# Patient Record
Sex: Male | Born: 1960 | Race: White | Hispanic: No | Marital: Single | Smoking: Former smoker
Health system: Southern US, Community
[De-identification: ages and names within clinical notes are randomized; demographics above are authoritative.]

## PROBLEM LIST (undated history)

## (undated) DIAGNOSIS — M1990 Unspecified osteoarthritis, unspecified site: Secondary | ICD-10-CM

## (undated) DIAGNOSIS — I10 Essential (primary) hypertension: Secondary | ICD-10-CM

## (undated) HISTORY — PX: KNEE SURGERY: SHX244

## (undated) HISTORY — PX: FRACTURE SURGERY: SHX138

## (undated) HISTORY — PX: TONSILLECTOMY: SUR1361

---

## 2014-06-10 ENCOUNTER — Ambulatory Visit: Payer: Self-pay | Admitting: Physician Assistant

## 2016-10-14 ENCOUNTER — Ambulatory Visit: Admission: EM | Admit: 2016-10-14 | Discharge: 2016-10-14 | Discharge status: Absent without leave | Payer: Self-pay

## 2016-10-15 ENCOUNTER — Ambulatory Visit
Admission: EM | Admit: 2016-10-15 | Discharge: 2016-10-15 | Disposition: A | Discharge status: Home / Self Care | Payer: PRIVATE HEALTH INSURANCE | Attending: Family Medicine | Admitting: Family Medicine

## 2016-10-15 ENCOUNTER — Ambulatory Visit (INDEPENDENT_AMBULATORY_CARE_PROVIDER_SITE_OTHER): Payer: PRIVATE HEALTH INSURANCE

## 2016-10-15 DIAGNOSIS — S46002A Unspecified injury of muscle(s) and tendon(s) of the rotator cuff of left shoulder, initial encounter: Secondary | ICD-10-CM

## 2016-10-15 HISTORY — DX: Essential (primary) hypertension: I10

## 2016-10-15 MED ORDER — NAPROXEN 500 MG PO TABS: 500.0000 mg | ORAL_TABLET | Freq: Two times a day (BID) | ORAL | 0 refills | Status: DC

## 2016-10-15 MED ORDER — METHYLPREDNISOLONE 4 MG PO TBPK: ORAL_TABLET | ORAL | 0 refills | Status: DC

## 2016-10-15 NOTE — ED Provider Notes (Signed)
CSN: [id number]     Arrival date & time 10/15/16  0803 History   First MD Initiated Contact with Patient 10/15/16 [phone]     Chief Complaint  Patient presents with  . Shoulder Pain   (Consider location/radiation/quality/duration/timing/severity/associated sxs/prior Treatment) HPI  This is a 56 year old male who presents with the acute onset of left arm pain. He states he's had 3-4 weeks of intermittent left shoulder pain. History of work he was extending his left arm which is nondominant wall carrying heavy tube and felt a pop in the indicates right at the coracoid process. Ring pain in the arm is unable to lift his arm over his shoulder. He has no radiation of the pain.        Past Medical History:  Diagnosis Date  . Hypertension    Past Surgical History:  Procedure Laterality Date  . KNEE SURGERY     History reviewed. No pertinent family history. Social History  Substance Use Topics  . Smoking status: Former Games developermoker  . Smokeless tobacco: Never Used  . Alcohol use Yes     Comment: some every day    Review of Systems  Constitutional: Positive for activity change. Negative for chills, fatigue and fever.  Musculoskeletal: Positive for arthralgias.  All other systems reviewed and are negative.   Allergies  Patient has no known allergies.  Home Medications   Prior to Admission medications   Medication Sig Start Date End Date Taking? Authorizing Provider  lisinopril (PRINIVIL,ZESTRIL) 30 MG tablet Take 30 mg by mouth daily.   Yes [provider]  methylPREDNISolone (MEDROL DOSEPAK) 4 MG TBPK tablet Take per package instructions 10/15/16   Lutricia Feiloemer, Mikey Maffett P, PA-C  naproxen (NAPROSYN) 500 MG tablet Take 1 tablet (500 mg total) by mouth 2 (two) times daily with a meal. 10/15/16   Lutricia Feiloemer, Katriel Cutsforth P, PA-C   Meds Ordered and Administered this Visit  Medications - No data to display  BP (!) 158/92 (BP Location: Right Arm)   Pulse 71   Temp 99 F (37.2 C) (Oral)    Resp 18   Ht 5\' 10"  (1.778 m)   Wt 235 lb (106.6 kg)   SpO2 100%   BMI 33.72 kg/m  No data found.   Physical Exam  Constitutional: He appears well-developed and well-nourished. No distress.  HENT:  Head: Normocephalic.  Eyes: Pupils are equal, round, and reactive to light.  Neck: Normal range of motion.  Musculoskeletal:  Examination of the left shoulder shows marked decrease range of motion actively but passively he is able to abduct to 90. Internal and external rotation are full. There is discomfort at the extremes of external rotation. Patient is unable to perform the arm drop test. Empty can test the patient is barely able to hold against gravity and gives way easily with resistance. He does not have any pain over the coracoid process nor under the subacromial area. Neck range of motion is full and comfortable.  Skin: He is not diaphoretic.  Nursing note and vitals reviewed.   Urgent Care Course     Procedures (including critical care time)  Labs Review Labs Reviewed - No data to display  Imaging Review Dg Shoulder Left  Result Date: 10/15/2016 CLINICAL DATA:  Left shoulder pain with limited range of motion after lifting injury yesterday. EXAM: LEFT SHOULDER - 2+ VIEW COMPARISON:  None. FINDINGS: There is no evidence of fracture or dislocation. Minimal degenerative changes at the [facility]. Soft tissues are unremarkable. IMPRESSION: No acute  abnormality.  Minimal arthritic changes at the [facility] joint. Electronically Signed   By: Francene Boyers M.D.   On: 10/15/2016 08:54     Visual Acuity Review  Right Eye Distance:   Left Eye Distance:   Bilateral Distance:    Right Eye Near:   Left Eye Near:    Bilateral Near:         MDM   1. Injury of tendon of left rotator cuff, initial encounter    Discharge Medication List as of 10/15/2016  9:11 AM    START taking these medications   Details  methylPREDNISolone (MEDROL DOSEPAK) 4 MG TBPK tablet Take per package  instructions, Normal    naproxen (NAPROSYN) 500 MG tablet Take 1 tablet (500 mg total) by mouth 2 (two) times daily with a meal., Starting Tue 10/15/2016, Normal      Plan: 1. Test/x-ray results and diagnosis reviewed with patient 2. rx as per orders; risks, benefits, potential side effects reviewed with patient 3. Recommend supportive treatment with Rest and symptom avoidance. he was instructed in pendulum exercises which He will perform 3 times daily for at least a minute each time. He will start with the Medrol Dosepak and when that is completed to switch over to Naprosyn as necessary for pain. He must be careful not to injure the arm by using it and have to alter his Routine at work to accomplish this. 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 4. F/u prn if symptoms worsen or don't improve     Lutricia Feil, PA-C 10/15/16 1610

## 2016-10-15 NOTE — ED Triage Notes (Signed)
Pt with 3-4 weeks of intermittent left shoulder pain. Yesterday at work extended his left arm while carrying a tube and felt a pop. Reports he is unable to lift his arm over his shoulder. Pain 5/10

## 2019-09-03 ENCOUNTER — Ambulatory Visit: Payer: PRIVATE HEALTH INSURANCE

## 2019-09-08 ENCOUNTER — Other Ambulatory Visit: Payer: Self-pay

## 2019-09-08 ENCOUNTER — Ambulatory Visit: Payer: PRIVATE HEALTH INSURANCE | Attending: Internal Medicine

## 2019-09-08 DIAGNOSIS — Z23 Encounter for immunization: Secondary | ICD-10-CM

## 2019-09-08 NOTE — Progress Notes (Signed)
   Covid-19 Vaccination Clinic  Name:  Thorsten Climer Leger    MRN: [id number] DOB: [birth year]  09/08/2019  Mr. Agan was observed post Covid-19 immunization for 15 minutes without incident. He was provided with Vaccine Information Sheet and instruction to access the V-Safe system.   Mr. Zehner was instructed to call 911 with any severe reactions post vaccine: Marland Kitchen Difficulty breathing  . Swelling of face and throat  . A fast heartbeat  . A bad rash all over body  . Dizziness and weakness   Immunizations Administered    Name Date Dose VIS Date Route   Pfizer COVID-19 Vaccine 09/08/2019  2:21 PM 0.3 mL 05/07/2019 Intramuscular   Manufacturer: [employer], [employer]   Lot: [id number]   NDC: [address]-9

## 2019-09-29 ENCOUNTER — Ambulatory Visit: Payer: PRIVATE HEALTH INSURANCE

## 2021-01-26 ENCOUNTER — Other Ambulatory Visit: Payer: Self-pay | Admitting: Orthopedic Surgery

## 2021-01-26 DIAGNOSIS — M4807 Spinal stenosis, lumbosacral region: Secondary | ICD-10-CM

## 2021-01-26 DIAGNOSIS — M545 Low back pain, unspecified: Secondary | ICD-10-CM

## 2021-01-26 DIAGNOSIS — M5136 Other intervertebral disc degeneration, lumbar region: Secondary | ICD-10-CM

## 2021-02-07 ENCOUNTER — Ambulatory Visit
Admission: RE | Admit: 2021-02-07 | Discharge: 2021-02-07 | Disposition: A | Discharge status: Home / Self Care | Payer: 59 | Source: Ambulatory Visit | Attending: Orthopedic Surgery | Admitting: Orthopedic Surgery

## 2021-02-07 ENCOUNTER — Other Ambulatory Visit: Payer: Self-pay

## 2021-02-07 DIAGNOSIS — M5136 Other intervertebral disc degeneration, lumbar region: Secondary | ICD-10-CM | POA: Diagnosis present

## 2021-02-07 DIAGNOSIS — M545 Low back pain, unspecified: Secondary | ICD-10-CM | POA: Diagnosis present

## 2021-02-07 DIAGNOSIS — M4807 Spinal stenosis, lumbosacral region: Secondary | ICD-10-CM | POA: Insufficient documentation

## 2021-07-19 ENCOUNTER — Other Ambulatory Visit: Payer: Self-pay | Admitting: Orthopedic Surgery

## 2021-08-06 ENCOUNTER — Other Ambulatory Visit: Payer: Self-pay

## 2021-08-06 ENCOUNTER — Encounter
Admission: RE | Admit: 2021-08-06 | Discharge: 2021-08-06 | Disposition: A | Discharge status: Home / Self Care | Payer: 59 | Source: Ambulatory Visit | Attending: Orthopedic Surgery | Admitting: Orthopedic Surgery

## 2021-08-06 VITALS — BP 162/83 | HR 95 | Resp 18 | Ht 69.0 in | Wt 258.2 lb

## 2021-08-06 DIAGNOSIS — Z01812 Encounter for preprocedural laboratory examination: Secondary | ICD-10-CM

## 2021-08-06 DIAGNOSIS — Z01818 Encounter for other preprocedural examination: Secondary | ICD-10-CM | POA: Diagnosis present

## 2021-08-06 HISTORY — DX: Unspecified osteoarthritis, unspecified site: M19.90

## 2021-08-06 LAB — COMPREHENSIVE METABOLIC PANEL
ALT: 35 U/L (ref 0–44)
AST: 24 U/L (ref 15–41)
Albumin: 4.5 g/dL (ref 3.5–5.0)
Alkaline Phosphatase: 73 U/L (ref 38–126)
Anion gap: 6 (ref 5–15)
BUN: 16 mg/dL (ref 8–23)
CO2: 26 mmol/L (ref 22–32)
Calcium: 9.7 mg/dL (ref 8.9–10.3)
Chloride: 104 mmol/L (ref 98–111)
Creatinine, Ser: 0.9 mg/dL (ref 0.61–1.24)
GFR, Estimated: >60 mL/min (ref >60)
Glucose, Bld: 103 mg/dL — ABNORMAL HIGH (ref 70–99)
Potassium: 4.4 mmol/L (ref 3.5–5.1)
Sodium: 136 mmol/L (ref 135–145)
Total Bilirubin: 0.6 mg/dL (ref 0.3–1.2)
Total Protein: 7.3 g/dL (ref 6.5–8.1)

## 2021-08-06 LAB — CBC WITH DIFFERENTIAL/PLATELET
Abs Immature Granulocytes: 0.02 10*3/uL (ref 0.00–0.07)
Basophils Absolute: 0.1 10*3/uL (ref 0.0–0.1)
Basophils Relative: 1 %
Eosinophils Absolute: 0.1 10*3/uL (ref 0.0–0.5)
Eosinophils Relative: 2 %
HCT: 43 % (ref 39.0–52.0)
Hemoglobin: 14.4 g/dL (ref 13.0–17.0)
Immature Granulocytes: 0 %
Lymphocytes Relative: 25 %
Lymphs Abs: 1.4 10*3/uL (ref 0.7–4.0)
MCH: 30.8 pg (ref 26.0–34.0)
MCHC: 33.5 g/dL (ref 30.0–36.0)
MCV: 92.1 fL (ref 80.0–100.0)
Monocytes Absolute: 0.5 10*3/uL (ref 0.1–1.0)
Monocytes Relative: 9 %
Neutro Abs: 3.6 10*3/uL (ref 1.7–7.7)
Neutrophils Relative %: 63 %
Platelets: 267 10*3/uL (ref 150–400)
RBC: 4.67 MIL/uL (ref 4.22–5.81)
RDW: 12.1 % (ref 11.5–15.5)
WBC: 5.7 10*3/uL (ref 4.0–10.5)
nRBC: 0 % (ref 0.0–0.2)

## 2021-08-06 LAB — TYPE AND SCREEN
ABO/RH(D): A POS
Antibody Screen: NEGATIVE

## 2021-08-06 LAB — URINALYSIS, ROUTINE W REFLEX MICROSCOPIC
Bilirubin Urine: NEGATIVE
Glucose, UA: NEGATIVE mg/dL
Hgb urine dipstick: NEGATIVE
Ketones, ur: NEGATIVE mg/dL
Leukocytes,Ua: NEGATIVE
Nitrite: NEGATIVE
Protein, ur: NEGATIVE mg/dL
Specific Gravity, Urine: 1.011 (ref 1.005–1.030)
pH: 6 (ref 5.0–8.0)

## 2021-08-06 LAB — SURGICAL PCR SCREEN
MRSA, PCR: NEGATIVE
Staphylococcus aureus: NEGATIVE

## 2021-08-06 NOTE — Patient Instructions (Addendum)
Your procedure is scheduled on: 08/17/21 - Friday ?Report to the Registration Desk on the 1st floor of the [facility]. ?To find out your arrival time, please call [phone] between 1PM - 3PM on: 08/16/21 - Thursday ?Report to [facility] for Covid test on 08/15/21 between 8 am and 12 noon. ? ?REMEMBER: ?Instructions that are not followed completely may result in serious medical risk, up to and including death; or upon the discretion of your surgeon and anesthesiologist your surgery may need to be rescheduled. ? ?Do not eat food after midnight the night before surgery.  ?No gum chewing, lozengers or hard candies. ? ?You may however, drink CLEAR liquids up to 2 hours before you are scheduled to arrive for your surgery. Do not drink anything within 2 hours of your scheduled arrival time. ? ?Clear liquids include: ?- water  ?- apple juice without pulp ?- gatorade (not RED colors) ?- black coffee or tea (Do NOT add milk or creamers to the coffee or tea) ?Do NOT drink anything that is not on this list. ? ?In addition, your doctor has ordered for you to drink the provided  ?Ensure Pre-Surgery Clear Carbohydrate Drink  ?Drinking this carbohydrate drink up to two hours before surgery helps to reduce insulin resistance and improve patient outcomes. Please complete drinking 2 hours prior to scheduled arrival time. ? ?TAKE THESE MEDICATIONS THE MORNING OF SURGERY WITH A SIP OF WATER: ?- amLODipine (NORVASC) 5 MG tablet ? ? ?One week prior to surgery: ?Stop Anti-inflammatories (NSAIDS) such as Advil, Aleve, Ibuprofen, Motrin, Naproxen, Naprosyn and Aspirin based products such as Excedrin, Goodys Powder, BC Powder. ? ?Stop ANY OVER THE COUNTER supplements until after surgery. ? ?You may however, continue to take Tylenol if needed for pain up until the day of surgery. ? ?No Alcohol for 24 hours before or after surgery. ? ?No Smoking including e-cigarettes for 24 hours prior to surgery.  ?No chewable tobacco products for at  least 6 hours prior to surgery.  ?No nicotine patches on the day of surgery. ? ?Do not use any "recreational" drugs for at least a week prior to your surgery.  ?Please be advised that the combination of cocaine and anesthesia may have negative outcomes, up to and including death. ?If you test positive for cocaine, your surgery will be cancelled. ? ?On the morning of surgery brush your teeth with toothpaste and water, you may rinse your mouth with mouthwash if you wish. ?Do not swallow any toothpaste or mouthwash. ? ?Use CHG Soap or wipes as directed on instruction sheet. ? ?Do not wear jewelry, make-up, hairpins, clips or nail polish. ? ?Do not wear lotions, powders, or perfumes.  ? ?Do not shave body from the neck down 48 hours prior to surgery just in case you cut yourself which could leave a site for infection.  ?Also, freshly shaved skin may become irritated if using the CHG soap. ? ?Contact lenses, hearing aids and dentures may not be worn into surgery. ? ?Do not bring valuables to the hospital. [facility] is not responsible for any missing/lost belongings or valuables.  ? ?Notify your doctor if there is any change in your medical condition (cold, fever, infection). ? ?Wear comfortable clothing (specific to your surgery type) to the hospital. ? ?After surgery, you can help prevent lung complications by doing breathing exercises.  ?Take deep breaths and cough every 1-2 hours. Your doctor may order a device called an Incentive Spirometer to help you take deep breaths. ?When  coughing or sneezing, hold a pillow firmly against your incision with both hands. This is called ?splinting.? Doing this helps protect your incision. It also decreases belly discomfort. ? ?If you are being admitted to the hospital overnight, leave your suitcase in the car. ?After surgery it may be brought to your room. ? ?If you are being discharged the day of surgery, you will not be allowed to drive home. ?You will need a responsible adult  (18 years or older) to drive you home and stay with you that night.  ? ?If you are taking public transportation, you will need to have a responsible adult (18 years or older) with you. ?Please confirm with your physician that it is acceptable to use public transportation.  ? ?Please call the Pre-admissions Testing Dept. at [phone] if you have any questions about these instructions. ? ?Surgery Visitation Policy: ? ?Patients undergoing a surgery or procedure may have one family member or support person with them as long as that person is not COVID-19 positive or experiencing its symptoms.  ?That person may remain in the waiting area during the procedure and may rotate out with other people. ? ?Inpatient Visitation:   ? ?Visiting hours are 7 a.m. to 8 p.m. ?Up to two visitors ages 16+ are allowed at one time in a patient room. The visitors may rotate out with other people during the day. Visitors must check out when they leave, or other visitors will not be allowed. One designated support person may remain overnight. ?The visitor must pass COVID-19 screenings, use hand sanitizer when entering and exiting the patient?s room and wear a mask at all times, including in the patient?s room. ?Patients must also wear a mask when staff or their visitor are in the room. ?Masking is required regardless of vaccination status.  ?

## 2021-08-15 ENCOUNTER — Other Ambulatory Visit: Admission: RE | Admit: 2021-08-15 | Payer: 59 | Source: Ambulatory Visit

## 2021-08-17 ENCOUNTER — Ambulatory Visit: Payer: 59

## 2021-08-17 ENCOUNTER — Other Ambulatory Visit: Payer: Self-pay

## 2021-08-17 ENCOUNTER — Ambulatory Visit: Payer: 59 | Admitting: Anesthesiology

## 2021-08-17 ENCOUNTER — Observation Stay: Payer: 59

## 2021-08-17 ENCOUNTER — Observation Stay
Admission: RE | Admit: 2021-08-17 | Discharge: 2021-08-18 | Disposition: A | Discharge status: Home / Self Care | Payer: 59 | Attending: Orthopedic Surgery | Admitting: Orthopedic Surgery

## 2021-08-17 ENCOUNTER — Ambulatory Visit: Payer: 59 | Admitting: Urgent Care

## 2021-08-17 ENCOUNTER — Encounter
Admission: RE | Disposition: A | Discharge status: Home / Self Care | Payer: Self-pay | Source: Home / Self Care | Attending: Orthopedic Surgery

## 2021-08-17 ENCOUNTER — Encounter: Payer: Self-pay | Admitting: Orthopedic Surgery

## 2021-08-17 DIAGNOSIS — M1611 Unilateral primary osteoarthritis, right hip: Principal | ICD-10-CM | POA: Insufficient documentation

## 2021-08-17 DIAGNOSIS — M25551 Pain in right hip: Secondary | ICD-10-CM | POA: Diagnosis present

## 2021-08-17 DIAGNOSIS — Z79899 Other long term (current) drug therapy: Secondary | ICD-10-CM | POA: Insufficient documentation

## 2021-08-17 DIAGNOSIS — Z96641 Presence of right artificial hip joint: Secondary | ICD-10-CM

## 2021-08-17 DIAGNOSIS — I10 Essential (primary) hypertension: Secondary | ICD-10-CM | POA: Diagnosis not present

## 2021-08-17 HISTORY — PX: TOTAL HIP ARTHROPLASTY: SHX124

## 2021-08-17 LAB — CBC
HCT: 38.8 % — ABNORMAL LOW (ref 39.0–52.0)
Hemoglobin: 13.2 g/dL (ref 13.0–17.0)
MCH: 31.2 pg (ref 26.0–34.0)
MCHC: 34 g/dL (ref 30.0–36.0)
MCV: 91.7 fL (ref 80.0–100.0)
Platelets: 247 10*3/uL (ref 150–400)
RBC: 4.23 MIL/uL (ref 4.22–5.81)
RDW: 12 % (ref 11.5–15.5)
WBC: 11.7 10*3/uL — ABNORMAL HIGH (ref 4.0–10.5)
nRBC: 0 % (ref 0.0–0.2)

## 2021-08-17 LAB — ABO/RH: ABO/RH(D): A POS

## 2021-08-17 LAB — CREATININE, SERUM
Creatinine, Ser: 0.85 mg/dL (ref 0.61–1.24)
GFR, Estimated: >60 mL/min (ref >60)

## 2021-08-17 SURGERY — ARTHROPLASTY, HIP, TOTAL, ANTERIOR APPROACH
Anesthesia: General | Site: Hip | Laterality: Right

## 2021-08-17 MED ORDER — BUPIVACAINE LIPOSOME 1.3 % IJ SUSP
INTRAMUSCULAR | Status: AC
  Filled 2021-08-17: qty 20

## 2021-08-17 MED ORDER — TRAMADOL HCL 50 MG PO TABS
50.0000 mg | ORAL_TABLET | Freq: Four times a day (QID) | ORAL | Status: DC
  Administered 2021-08-17 – 2021-08-18 (×3): 50 mg via ORAL
  Filled 2021-08-17 (×3): qty 1

## 2021-08-17 MED ORDER — SODIUM CHLORIDE 0.9 % IR SOLN
Status: DC | PRN
  Administered 2021-08-17: 1004 mL

## 2021-08-17 MED ORDER — NEOMYCIN-POLYMYXIN B GU 40-200000 IR SOLN
Status: AC
  Filled 2021-08-17: qty 4

## 2021-08-17 MED ORDER — ONDANSETRON HCL 4 MG/2ML IJ SOLN: 4.0000 mg | Freq: Four times a day (QID) | INTRAMUSCULAR | Status: DC | PRN

## 2021-08-17 MED ORDER — STERILE WATER FOR IRRIGATION IR SOLN
Status: DC | PRN
  Administered 2021-08-17: 1000 mL

## 2021-08-17 MED ORDER — MIDAZOLAM HCL 5 MG/5ML IJ SOLN
INTRAMUSCULAR | Status: DC | PRN
  Administered 2021-08-17 (×2): 2 mg via INTRAVENOUS

## 2021-08-17 MED ORDER — BUPIVACAINE-EPINEPHRINE (PF) 0.25% -1:200000 IJ SOLN
INTRAMUSCULAR | Status: AC
Start: 2021-08-17 — End: ?
  Filled 2021-08-17: qty 30

## 2021-08-17 MED ORDER — CHLORHEXIDINE GLUCONATE 0.12 % MT SOLN
OROMUCOSAL | Status: AC
  Administered 2021-08-17: 15 mL via OROMUCOSAL
  Filled 2021-08-17: qty 15

## 2021-08-17 MED ORDER — ENOXAPARIN SODIUM 40 MG/0.4ML IJ SOSY
40.0000 mg | PREFILLED_SYRINGE | INTRAMUSCULAR | Status: DC
  Administered 2021-08-18: 40 mg via SUBCUTANEOUS
  Filled 2021-08-17: qty 0.4

## 2021-08-17 MED ORDER — ACETAMINOPHEN 10 MG/ML IV SOLN: 1000.0000 mg | Freq: Once | INTRAVENOUS | Status: DC | PRN

## 2021-08-17 MED ORDER — PHENYLEPHRINE HCL-NACL 20-0.9 MG/250ML-% IV SOLN
INTRAVENOUS | Status: DC | PRN
  Administered 2021-08-17: 35 ug/min via INTRAVENOUS

## 2021-08-17 MED ORDER — BUPIVACAINE HCL (PF) 0.5 % IJ SOLN
INTRAMUSCULAR | Status: DC | PRN
  Administered 2021-08-17: 2.5 mL via INTRATHECAL

## 2021-08-17 MED ORDER — HYDROMORPHONE HCL 1 MG/ML IJ SOLN
0.5000 mg | INTRAMUSCULAR | Status: DC | PRN
  Filled 2021-08-17: qty 1

## 2021-08-17 MED ORDER — ACETAMINOPHEN 10 MG/ML IV SOLN
INTRAVENOUS | Status: DC | PRN
  Administered 2021-08-17: 1000 mg via INTRAVENOUS

## 2021-08-17 MED ORDER — LISINOPRIL 20 MG PO TABS
40.0000 mg | ORAL_TABLET | Freq: Every day | ORAL | Status: DC
  Administered 2021-08-18: 40 mg via ORAL
  Filled 2021-08-17: qty 2

## 2021-08-17 MED ORDER — FENTANYL CITRATE (PF) 100 MCG/2ML IJ SOLN: 25.0000 ug | INTRAMUSCULAR | Status: DC | PRN

## 2021-08-17 MED ORDER — MAGNESIUM HYDROXIDE 400 MG/5ML PO SUSP
30.0000 mL | Freq: Every day | ORAL | Status: DC
  Administered 2021-08-17: 30 mL via ORAL
  Filled 2021-08-17: qty 30

## 2021-08-17 MED ORDER — FENTANYL CITRATE (PF) 100 MCG/2ML IJ SOLN
INTRAMUSCULAR | Status: AC
  Filled 2021-08-17: qty 2

## 2021-08-17 MED ORDER — ADULT MULTIVITAMIN W/MINERALS CH
1.0000 | ORAL_TABLET | Freq: Every day | ORAL | Status: DC
  Administered 2021-08-18: 1 via ORAL
  Filled 2021-08-17: qty 1

## 2021-08-17 MED ORDER — SENNOSIDES-DOCUSATE SODIUM 8.6-50 MG PO TABS: 1.0000 | ORAL_TABLET | Freq: Every evening | ORAL | Status: DC | PRN

## 2021-08-17 MED ORDER — MIDAZOLAM HCL 2 MG/2ML IJ SOLN
INTRAMUSCULAR | Status: AC
  Filled 2021-08-17: qty 2

## 2021-08-17 MED ORDER — ONDANSETRON HCL 4 MG/2ML IJ SOLN
INTRAMUSCULAR | Status: DC | PRN
  Administered 2021-08-17: 4 mg via INTRAVENOUS

## 2021-08-17 MED ORDER — ACETAMINOPHEN 10 MG/ML IV SOLN
INTRAVENOUS | Status: AC
  Filled 2021-08-17: qty 100

## 2021-08-17 MED ORDER — OXYCODONE HCL 5 MG PO TABS: 10.0000 mg | ORAL_TABLET | ORAL | Status: DC | PRN

## 2021-08-17 MED ORDER — BISACODYL 5 MG PO TBEC: 5.0000 mg | DELAYED_RELEASE_TABLET | Freq: Every day | ORAL | Status: DC | PRN

## 2021-08-17 MED ORDER — METOCLOPRAMIDE HCL 5 MG/ML IJ SOLN: 5.0000 mg | Freq: Three times a day (TID) | INTRAMUSCULAR | Status: DC | PRN

## 2021-08-17 MED ORDER — ALUM & MAG HYDROXIDE-SIMETH 200-200-20 MG/5ML PO SUSP: 30.0000 mL | ORAL | Status: DC | PRN

## 2021-08-17 MED ORDER — ACETAMINOPHEN 500 MG PO TABS
1000.0000 mg | ORAL_TABLET | Freq: Four times a day (QID) | ORAL | Status: DC
  Administered 2021-08-17 – 2021-08-18 (×2): 1000 mg via ORAL
  Filled 2021-08-17 (×3): qty 2

## 2021-08-17 MED ORDER — DIPHENHYDRAMINE HCL 12.5 MG/5ML PO ELIX: 12.5000 mg | ORAL_SOLUTION | ORAL | Status: DC | PRN

## 2021-08-17 MED ORDER — ONDANSETRON HCL 4 MG PO TABS: 4.0000 mg | ORAL_TABLET | Freq: Four times a day (QID) | ORAL | Status: DC | PRN

## 2021-08-17 MED ORDER — MIDAZOLAM HCL 2 MG/2ML IJ SOLN
INTRAMUSCULAR | Status: AC
Start: 2021-08-17 — End: ?
  Filled 2021-08-17: qty 2

## 2021-08-17 MED ORDER — MENTHOL 3 MG MT LOZG
1.0000 | LOZENGE | OROMUCOSAL | Status: DC | PRN
  Filled 2021-08-17: qty 9

## 2021-08-17 MED ORDER — CEFAZOLIN SODIUM-DEXTROSE 2-4 GM/100ML-% IV SOLN
2.0000 g | INTRAVENOUS | Status: AC
  Administered 2021-08-17: 2 g via INTRAVENOUS

## 2021-08-17 MED ORDER — METHOCARBAMOL 1000 MG/10ML IJ SOLN
500.0000 mg | Freq: Four times a day (QID) | INTRAVENOUS | Status: DC | PRN
  Filled 2021-08-17: qty 5

## 2021-08-17 MED ORDER — OXYCODONE HCL 5 MG/5ML PO SOLN: 5.0000 mg | Freq: Once | ORAL | Status: DC | PRN

## 2021-08-17 MED ORDER — METHOCARBAMOL 500 MG PO TABS: 500.0000 mg | ORAL_TABLET | Freq: Four times a day (QID) | ORAL | Status: DC | PRN

## 2021-08-17 MED ORDER — CEFAZOLIN SODIUM-DEXTROSE 2-4 GM/100ML-% IV SOLN
INTRAVENOUS | Status: AC
  Filled 2021-08-17: qty 100

## 2021-08-17 MED ORDER — LACTATED RINGERS IV SOLN: INTRAVENOUS | Status: DC

## 2021-08-17 MED ORDER — ONDANSETRON HCL 4 MG/2ML IJ SOLN: 4.0000 mg | Freq: Once | INTRAMUSCULAR | Status: DC | PRN

## 2021-08-17 MED ORDER — FAMOTIDINE 20 MG PO TABS: 20.0000 mg | ORAL_TABLET | Freq: Once | ORAL | Status: AC

## 2021-08-17 MED ORDER — ORAL CARE MOUTH RINSE: 15.0000 mL | Freq: Once | OROMUCOSAL | Status: AC

## 2021-08-17 MED ORDER — FAMOTIDINE 20 MG PO TABS
ORAL_TABLET | ORAL | Status: AC
  Administered 2021-08-17: 20 mg via ORAL
  Filled 2021-08-17: qty 1

## 2021-08-17 MED ORDER — CEFAZOLIN SODIUM-DEXTROSE 2-4 GM/100ML-% IV SOLN
2.0000 g | Freq: Four times a day (QID) | INTRAVENOUS | Status: AC
  Administered 2021-08-17 (×2): 2 g via INTRAVENOUS
  Filled 2021-08-17 (×2): qty 100

## 2021-08-17 MED ORDER — OXYCODONE HCL 5 MG PO TABS: 5.0000 mg | ORAL_TABLET | ORAL | Status: DC | PRN

## 2021-08-17 MED ORDER — SODIUM CHLORIDE FLUSH 0.9 % IV SOLN
INTRAVENOUS | Status: AC
  Filled 2021-08-17: qty 40

## 2021-08-17 MED ORDER — PROPOFOL 500 MG/50ML IV EMUL
INTRAVENOUS | Status: DC | PRN
  Administered 2021-08-17: 80 ug/kg/min via INTRAVENOUS

## 2021-08-17 MED ORDER — ZOLPIDEM TARTRATE 5 MG PO TABS: 5.0000 mg | ORAL_TABLET | Freq: Every evening | ORAL | Status: DC | PRN

## 2021-08-17 MED ORDER — DOCUSATE SODIUM 100 MG PO CAPS
100.0000 mg | ORAL_CAPSULE | Freq: Two times a day (BID) | ORAL | Status: DC
  Administered 2021-08-17 – 2021-08-18 (×3): 100 mg via ORAL
  Filled 2021-08-17 (×3): qty 1

## 2021-08-17 MED ORDER — HEMOSTATIC AGENTS (NO CHARGE) OPTIME
TOPICAL | Status: DC | PRN
  Administered 2021-08-17: 2 via TOPICAL

## 2021-08-17 MED ORDER — PROPOFOL 1000 MG/100ML IV EMUL
INTRAVENOUS | Status: AC
  Filled 2021-08-17: qty 100

## 2021-08-17 MED ORDER — FENTANYL CITRATE (PF) 100 MCG/2ML IJ SOLN
INTRAMUSCULAR | Status: DC | PRN
  Administered 2021-08-17 (×2): 50 ug via INTRAVENOUS

## 2021-08-17 MED ORDER — PANTOPRAZOLE SODIUM 40 MG PO TBEC
40.0000 mg | DELAYED_RELEASE_TABLET | Freq: Every day | ORAL | Status: DC
  Administered 2021-08-17 – 2021-08-18 (×2): 40 mg via ORAL
  Filled 2021-08-17 (×2): qty 1

## 2021-08-17 MED ORDER — ACETAMINOPHEN 325 MG PO TABS: 325.0000 mg | ORAL_TABLET | Freq: Four times a day (QID) | ORAL | Status: DC | PRN

## 2021-08-17 MED ORDER — SODIUM CHLORIDE 0.9 % IV SOLN: INTRAVENOUS | Status: DC

## 2021-08-17 MED ORDER — OXYCODONE HCL 5 MG PO TABS: 5.0000 mg | ORAL_TABLET | Freq: Once | ORAL | Status: DC | PRN

## 2021-08-17 MED ORDER — METOCLOPRAMIDE HCL 5 MG PO TABS
5.0000 mg | ORAL_TABLET | Freq: Three times a day (TID) | ORAL | Status: DC | PRN
  Filled 2021-08-17: qty 2

## 2021-08-17 MED ORDER — CHLORHEXIDINE GLUCONATE 0.12 % MT SOLN: 15.0000 mL | Freq: Once | OROMUCOSAL | Status: AC

## 2021-08-17 MED ORDER — FLEET ENEMA 7-19 GM/118ML RE ENEM: 1.0000 | ENEMA | Freq: Once | RECTAL | Status: DC | PRN

## 2021-08-17 MED ORDER — PHENOL 1.4 % MT LIQD
1.0000 | OROMUCOSAL | Status: DC | PRN
  Filled 2021-08-17: qty 177

## 2021-08-17 MED ORDER — SODIUM CHLORIDE (PF) 0.9 % IJ SOLN
INTRAMUSCULAR | Status: DC | PRN
  Administered 2021-08-17: 90 mL

## 2021-08-17 MED ORDER — AMLODIPINE BESYLATE 5 MG PO TABS
5.0000 mg | ORAL_TABLET | Freq: Every day | ORAL | Status: DC
  Administered 2021-08-18: 5 mg via ORAL
  Filled 2021-08-17: qty 1

## 2021-08-17 SURGICAL SUPPLY — 51 items
BLADE SAGITTAL AGGR TOOTH XLG (BLADE) ×2 IMPLANT
BNDG COHESIVE 6X5 TAN ST LF (GAUZE/BANDAGES/DRESSINGS) ×6 IMPLANT
CANISTER WOUND CARE 500ML ATS (WOUND CARE) ×2 IMPLANT
CHLORAPREP W/TINT 26 (MISCELLANEOUS) ×2 IMPLANT
COVER BACK TABLE REUSABLE LG (DRAPES) ×2 IMPLANT
CUP ACETAB VERSA DBL 28X58 DMI (Orthopedic Implant) ×1 IMPLANT
DRAPE 3/4 80X56 (DRAPES) ×6 IMPLANT
DRAPE C-ARM XRAY 36X54 (DRAPES) ×2 IMPLANT
DRAPE INCISE IOBAN 66X60 STRL (DRAPES) IMPLANT
DRAPE POUCH INSTRU U-SHP 10X18 (DRAPES) ×2 IMPLANT
DRESSING SURGICEL FIBRLLR 1X2 (HEMOSTASIS) ×2 IMPLANT
DRSG MEPILEX SACRM 8.7X9.8 (GAUZE/BANDAGES/DRESSINGS) ×2 IMPLANT
DRSG SURGICEL FIBRILLAR 1X2 (HEMOSTASIS) ×4
ELECT BLADE 6.5 EXT (BLADE) ×2 IMPLANT
ELECT REM PT RETURN 9FT ADLT (ELECTROSURGICAL) ×2
ELECTRODE REM PT RTRN 9FT ADLT (ELECTROSURGICAL) ×1 IMPLANT
GLOVE SURG SYN 9.0  PF PI (GLOVE) ×2
GLOVE SURG SYN 9.0 PF PI (GLOVE) ×2 IMPLANT
GLOVE SURG UNDER POLY LF SZ9 (GLOVE) ×2 IMPLANT
GOWN SRG 2XL LVL 4 RGLN SLV (GOWNS) ×1 IMPLANT
GOWN STRL NON-REIN 2XL LVL4 (GOWNS) ×1
GOWN STRL REUS W/ TWL LRG LVL3 (GOWN DISPOSABLE) ×1 IMPLANT
GOWN STRL REUS W/TWL LRG LVL3 (GOWN DISPOSABLE) ×1
HEAD FEMORAL SZ28 LGE BIOLOX (Head) ×1 IMPLANT
HOLDER FOLEY CATH W/STRAP (MISCELLANEOUS) ×2 IMPLANT
KIT PREVENA INCISION MGT 13 (CANNISTER) ×2 IMPLANT
MANIFOLD NEPTUNE II (INSTRUMENTS) ×2 IMPLANT
MAT ABSORB  FLUID 56X50 GRAY (MISCELLANEOUS) ×1
MAT ABSORB FLUID 56X50 GRAY (MISCELLANEOUS) ×1 IMPLANT
NDL SPNL 20GX3.5 QUINCKE YW (NEEDLE) ×2 IMPLANT
NEEDLE SPNL 20GX3.5 QUINCKE YW (NEEDLE) ×4 IMPLANT
NS IRRIG 1000ML POUR BTL (IV SOLUTION) ×2 IMPLANT
PACK HIP COMPR (MISCELLANEOUS) ×2 IMPLANT
SCALPEL PROTECTED #10 DISP (BLADE) ×4 IMPLANT
SHELL ACETABULAR SZ0 58MM (Shell) ×1 IMPLANT
STAPLER SKIN PROX 35W (STAPLE) ×2 IMPLANT
STEM MASTERLOC HIP LAT S7 (Stem) ×1 IMPLANT
STRAP SAFETY 5IN WIDE (MISCELLANEOUS) ×2 IMPLANT
SUT DVC 2 QUILL PDO  T11 36X36 (SUTURE) ×1
SUT DVC 2 QUILL PDO T11 36X36 (SUTURE) ×1 IMPLANT
SUT SILK 0 (SUTURE) ×1
SUT SILK 0 30XBRD TIE 6 (SUTURE) ×1 IMPLANT
SUT V-LOC 90 ABS DVC 3-0 CL (SUTURE) ×2 IMPLANT
SUT VIC AB 1 CT1 36 (SUTURE) ×2 IMPLANT
SYR 20ML LL LF (SYRINGE) ×2 IMPLANT
SYR 30ML LL (SYRINGE) ×2 IMPLANT
SYR 50ML LL SCALE MARK (SYRINGE) ×4 IMPLANT
SYR BULB IRRIG 60ML STRL (SYRINGE) ×2 IMPLANT
TAPE MICROFOAM 4IN (TAPE) ×2 IMPLANT
TOWEL OR 17X26 4PK STRL BLUE (TOWEL DISPOSABLE) IMPLANT
TRAY FOLEY MTR SLVR 16FR STAT (SET/KITS/TRAYS/PACK) ×2 IMPLANT

## 2021-08-17 NOTE — Evaluation (Signed)
Physical Therapy Evaluation ?Patient Details ?Name: Larry Michael ?MRN: [id number] ?DOB: [birth year] ?Today's Date: 08/17/2021 ? ?History of Present Illness ? Pt is a 61 yo M with PMH of HTN who was diagnosed with primary osteoarthritis of right hip and is now s/p elective R THA. ?  ?Clinical Impression ? Pt was pleasant and motivated to participate during the session and put forth good effort throughout. Pt required no physical assistance during the session but did require cuing for general sequencing with transfers and gait.  Pt was able to amb near the EOB and then to the chair with good stability and with no adverse symptoms.  Pt is expected to make good progress towards goals while in acute care.  Pt will benefit from HHPT upon discharge to safely address deficits listed in patient problem list for decreased caregiver assistance and eventual return to PLOF. ? ?   ?   ? ?Recommendations for follow up therapy are one component of a multi-disciplinary discharge planning process, led by the attending physician.  Recommendations may be updated based on patient status, additional functional criteria and insurance authorization. ? ?Follow Up Recommendations Home health PT ? ?  ?Assistance Recommended at Discharge Intermittent Supervision/Assistance  ?Patient can return home with the following ? A little help with walking and/or transfers;A little help with bathing/dressing/bathroom;Assistance with cooking/housework;Help with stairs or ramp for entrance;Assist for transportation ? ?  ?Equipment Recommendations Other (comment) ([facility] benefit [facility], TBD, has elevated toilet with sink counter to assist)  ?Recommendations for Other Services ?    ?  ?Functional Status Assessment Patient has had a recent decline in their functional status and demonstrates the ability to make significant improvements in function in a reasonable and predictable amount of time.  ? ?  ?Precautions / Restrictions Precautions ?Precautions:  Anterior Hip;Fall ?Precaution Booklet Issued: Yes (comment) ?Restrictions ?Weight Bearing Restrictions: Yes ?RLE Weight Bearing: Weight bearing as tolerated  ? ?  ? ?Mobility ? Bed Mobility ?Overal bed mobility: Needs Assistance ?Bed Mobility: Supine to Sit ?  ?  ?Supine to sit: Supervision ?  ?  ?General bed mobility comments: Extra time, effort, and use of the bed rail required ?  ? ?Transfers ?Overall transfer level: Needs assistance ?Equipment used: Rolling walker (2 wheels) ?Transfers: Sit to/from Stand ?Sit to Stand: Min guard ?  ?  ?  ?  ?  ?General transfer comment: Min verbal cues for hand placement; good control and stability ?  ? ?Ambulation/Gait ?Ambulation/Gait assistance: Min guard ?Gait Distance (Feet): 5 Feet ?Assistive device: Rolling walker (2 wheels) ?Gait Pattern/deviations: Step-to pattern, Antalgic, Decreased stance time - right ?Gait velocity: decreased ?  ?  ?General Gait Details: Pt able to take several steps at the EOB and then bed to chair with no LOB or buckling ? ?Stairs ?  ?  ?  ?  ?  ? ?Wheelchair Mobility ?  ? ?Modified Rankin (Stroke Patients Only) ?  ? ?  ? ?Balance Overall balance assessment: Needs assistance ?Sitting-balance support: No upper extremity supported, Feet supported ?Sitting balance-Leahy Scale: Good ?  ?  ?Standing balance support: Bilateral upper extremity supported, During functional activity ?Standing balance-Leahy Scale: Good ?Standing balance comment: Minimal lean on the RW for pain control, no instability noted ?  ?  ?  ?  ?  ?  ?  ?  ?  ?  ?  ?   ? ? ? ?Pertinent Vitals/Pain Pain Assessment ?Pain Assessment: 0-10 ?Pain Score: 2  ?Pain Location:  R hip ?Pain Descriptors / Indicators: Sore ?Pain Intervention(s): Repositioned, Premedicated before session, Monitored during session  ? ? ?Home Living Family/patient expects to be discharged to:: Private residence ?Living Arrangements: Other relatives ?Available Help at Discharge: Family;Available 24 hours/day ?Type of  Home: House ?Home Access: Stairs to enter ?Entrance Stairs-Rails: Left ?Entrance Stairs-Number of Steps: 2 ?  ?Home Layout: Two level;Able to live on main level with bedroom/bathroom ?Home Equipment: Agricultural consultant (2 wheels) ?   ?  ?Prior Function   ?  ?  ?  ?  ?  ?  ?Mobility Comments: Ind amb without an AD limited community distances only secondary to R hip pain, no fall history ?ADLs Comments: Ind with ADLs ?  ? ? ?Hand Dominance  ?   ? ?  ?Extremity/Trunk Assessment  ? Upper Extremity Assessment ?Upper Extremity Assessment: Overall WFL for tasks assessed ?  ? ?Lower Extremity Assessment ?Lower Extremity Assessment: Generalized weakness;RLE deficits/detail ?RLE Deficits / Details: BLE ankle AROM, strength, and sensation to light touch grossly intact ?RLE: Unable to fully assess due to pain ?RLE Sensation: WNL ?  ? ?   ?Communication  ? Communication: No difficulties  ?Cognition Arousal/Alertness: Awake/alert ?Behavior During Therapy: [facility] for tasks assessed/performed ?Overall Cognitive Status: Within Functional Limits for tasks assessed ?  ?  ?  ?  ?  ?  ?  ?  ?  ?  ?  ?  ?  ?  ?  ?  ?  ?  ?  ? ?  ?General Comments   ? ?  ?Exercises Total Joint Exercises ?Ankle Circles/Pumps: AROM, Strengthening, Both, 10 reps ?Quad Sets: Strengthening, Both, 10 reps ?Gluteal Sets: Strengthening, Both, 10 reps ?Long Arc Quad: AROM, Strengthening, Both, 10 reps ?Knee Flexion: AROM, Strengthening, Both, 10 reps ?Marching in Standing: Strengthening, Both, 5 reps, Standing ?Other Exercises ?Other Exercises: HEP education per handout ?Other Exercises: Anterior hip precaution education  ? ?Assessment/Plan  ?  ?PT Assessment Patient needs continued PT services  ?PT Problem List Decreased strength;Decreased activity tolerance;Decreased balance;Decreased mobility;Decreased knowledge of use of DME;Pain;Decreased knowledge of precautions ? ?   ?  ?PT Treatment Interventions DME instruction;Gait training;Stair training;Functional mobility  training;Therapeutic activities;Therapeutic exercise;Balance training;Patient/family education   ? ?PT Goals (Current goals can be found in the Care Plan section)  ?Acute Rehab PT Goals ?Patient Stated Goal: To walk without pain and to golf ?PT Goal Formulation: With patient ?Time For Goal Achievement: 08/30/21 ?Potential to Achieve Goals: Good ? ?  ?Frequency BID ?  ? ? ?Co-evaluation   ?  ?  ?  ?  ? ? ?  ?AM-PAC PT "6 Clicks" Mobility  ?Outcome Measure Help needed turning from your back to your side while in a flat bed without using bedrails?: A Little ?Help needed moving from lying on your back to sitting on the side of a flat bed without using bedrails?: A Little ?Help needed moving to and from a bed to a chair (including a wheelchair)?: A Little ?Help needed standing up from a chair using your arms (e.g., wheelchair or bedside chair)?: A Little ?Help needed to walk in hospital room?: A Little ?Help needed climbing 3-5 steps with a railing? : A Lot ?6 Click Score: 17 ? ?  ?End of Session Equipment Utilized During Treatment: Gait belt ?Activity Tolerance: Patient tolerated treatment well ?Patient left: in chair;with call bell/phone within reach;with chair alarm set;with SCD's reapplied ?Nurse Communication: Mobility status;Weight bearing status ?PT Visit Diagnosis: Other abnormalities of gait and mobility (  R26.89);Muscle weakness (generalized) (M62.81);Pain ?Pain - Right/Left: Right ?Pain - part of body: Hip ?  ? ?Time: 2774-1287 ?PT Time Calculation (min) (ACUTE ONLY): 46 min ? ? ?Charges:   PT Evaluation ?$PT Eval Moderate Complexity: 1 Mod ?PT Treatments ?$Therapeutic Exercise: 8-22 mins ?$Therapeutic Activity: 8-22 mins ?  ?   ? ?D. Elly Modena PT, DPT ?08/17/21, 5:25 PM ? ? ?

## 2021-08-17 NOTE — Transfer of Care (Signed)
Immediate Anesthesia Transfer of Care Note ? ?Patient: Larry Michael ? ?Procedure(s) Performed: TOTAL HIP ARTHROPLASTY ANTERIOR APPROACH (Right: Hip) ? ?Patient Location: PACU ? ?Anesthesia Type:General ? ?Level of Consciousness: awake and patient cooperative ? ?Airway & Oxygen Therapy: Patient Spontanous Breathing ? ?Post-op Assessment: Report given to RN and Post -op Vital signs reviewed and stable ? ?Post vital signs: Reviewed and stable ? ?Last Vitals:  ?Vitals Value Taken Time  ?BP 118/72 08/17/21 1417  ?Temp    ?Pulse 80 08/17/21 1418  ?Resp 20 08/17/21 1418  ?SpO2 97 % 08/17/21 1418  ?Vitals shown include unvalidated device data. ? ?Last Pain:  ?Vitals:  ? 08/17/21 1115  ?TempSrc: Temporal  ?PainSc: 0-No pain  ?   ? ?  ? ?Complications: No notable events documented. ?

## 2021-08-17 NOTE — Plan of Care (Signed)
  Problem: Education: Goal: Knowledge of General Education information will improve Description: Including pain rating scale, medication(s)/side effects and non-pharmacologic comfort measures Outcome: Progressing   Problem: Activity: Goal: Risk for activity intolerance will decrease Outcome: Progressing   Problem: Nutrition: Goal: Adequate nutrition will be maintained Outcome: Progressing   

## 2021-08-17 NOTE — Plan of Care (Signed)
?  Problem: Education: ?Goal: Knowledge of General Education information will improve ?Description: Including pain rating scale, medication(s)/side effects and non-pharmacologic comfort measures ?08/17/2021 2210 by Deirdre Pippins, RN ?Outcome: Progressing ?08/17/2021 2205 by Deirdre Pippins, RN ?Outcome: Progressing ?  ?Problem: Clinical Measurements: ?Goal: Ability to maintain clinical measurements within normal limits will improve ?Outcome: Progressing ?Goal: Will remain free from infection ?Outcome: Progressing ?  ?Problem: Activity: ?Goal: Risk for activity intolerance will decrease ?08/17/2021 2210 by Deirdre Pippins, RN ?Outcome: Progressing ?08/17/2021 2205 by Deirdre Pippins, RN ?Outcome: Progressing ?  ?Problem: Nutrition: ?Goal: Adequate nutrition will be maintained ?08/17/2021 2210 by Deirdre Pippins, RN ?Outcome: Progressing ?08/17/2021 2205 by Deirdre Pippins, RN ?Outcome: Progressing ?  ?

## 2021-08-17 NOTE — H&P (Signed)
?Chief Complaint  ?Patient presents with  ? Pre-op Exam  ?Scheduled for Right THA 08/17/21 with Dr. Rosita Kea  ? ? ?History of the Present Illness: ?Larry Michael is a 61 y.o. male here today for history and physical for right total hip arthroplasty with Dr. Kennedy Bucker on 08/17/2021. Patient has had greater than 1 year of right hip pain located in the groin that has been interfering with his quality of life and activities daily living. He describes moderate to severe pain. He is seen physiatry and had a hip injection with very short-term relief. Patient has x-rays showing advanced right hip osteoarthritis. He is a very active person, enjoys playing golf and works a lot climbing a Engineer, agricultural. He is unable to complete 18 holes of golf and is unable to climb a ladder. Patient's pain is 10 out of 10 at times located in his groin and lateral hip.  ? ?Patient is not diabetic and has no history of blood clots. ? ?The patient is employed doing labor work Engineer, mining doors and motorized units. ? ?I have reviewed past medical, surgical, social and family history, and allergies as documented in the EMR. ? ?Past Medical History: ?Past Medical History:  ?Diagnosis Date  ? Alopecia areata 06/18/2017  ? Condylomata acuminata  ? Erectile dysfunction  ? Essential hypertension, benign  ? GERD (gastroesophageal reflux disease)  ? Hyperlipidemia  ? Incomplete tear of left rotator cuff 10/14/2016  ? 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 Primary osteoarthritis of left knee 12/16/2016  ? Rupture of right distal biceps tendon 05/08/2015  ? ?Past Surgical History: ?Past Surgical History:  ?Procedure Laterality Date  ? Open Meniscal open repair Left Knee  ? ORIF Right Ankle fx  ? ?Past Family History: ?Family History  ?Problem Relation Age of Onset  ? Breast cancer Cousin  ? ?Medications: ?Current Outpatient Medications Ordered in Epic  ?Medication Sig Dispense Refill  ? acetaminophen (TYLENOL) 500 MG tablet Take 1,000 mg by mouth  once daily  ? amLODIPine (NORVASC) 5 MG tablet Take 1 tablet (5 mg total) by mouth once daily for blood pressure 90 tablet 0  ? lisinopriL (ZESTRIL) 40 MG tablet Take 1 tablet (40 mg total) by mouth once daily for blood pressure 90 tablet 0  ? multivitamin tablet Take 1 tablet by mouth once daily.  ? ?No current Epic-ordered facility-administered medications on file.  ? ?Allergies: ?No Known Allergies  ? ?Body mass index is 36.3 kg/m?. ? ?Review of Systems: ?A comprehensive 14 point ROS was performed, reviewed, and the pertinent orthopaedic findings are documented in the HPI. ? ?Vitals:  ?08/10/21 0906  ?BP: (!) 142/84  ? ? ?General Physical Examination:  ?General:  ?Well developed, well nourished, no apparent distress, normal affect, antalgic gait with no assistive devices. ? ?HEENT: ?Head normocephalic, atraumatic, PERRL.  ? ?Abdomen: ?Soft, non tender, non distended, Bowel sounds present. ? ?Heart: ?Examination of the heart reveals regular, rate, and rhythm. There is no murmur noted on ascultation. There is a normal apical pulse. ? ?Lungs: ?Lungs are clear to auscultation. There is no wheeze, rhonchi, or crackles. There is normal expansion of bilateral chest walls.  ? ?Right hip: ?Right hip has 5 degrees internal rotation and 20 degrees external rotation. Right flexion contracture. No swelling or edema throughout the right lower extremity. Neurovascular intact right lower extremity ? ?Radiographs: ?AP pelvis and lateral x-rays of the right hip were reviewed by me today. These show complete loss of superior joint space with some  loss of superolateral bone. There is subchondral sclerosis and osteophytes, some lateral subluxation. Lateral view shows subchondral cyst formation on head and acetabulum. ? ?X-ray Impression ?Severe osteoarthritis, right hip. ? ?Assessment: ?ICD-10-CM  ?1. Primary osteoarthritis of right hip M16.11  ? ?Plan: ?66. 61 year old male with advanced right hip osteoarthritis. Pain increasing over  the last year to the point where it is interfering with his quality of life and activities daily living. He has had no relief with conservative treatment. Risks, benefits, complications of a right total hip arthroplasty have been discussed with the patient. Patient has agreed and consented the procedure with Dr. Kennedy Bucker on 08/17/2021. ?Electronically signed by Patience Musca, PA at 08/10/2021 9:57 AM EDT ? ?Reviewed  H+P. ?No changes noted. ? ? ?

## 2021-08-17 NOTE — Op Note (Signed)
08/17/2021 ? ?2:20 PM ? ?PATIENT:  Larry Michael  61 y.o. male ? ?PRE-OPERATIVE DIAGNOSIS:  Primary osteoarthritis of right hip  M16.11 ?Chronic right hip pain  M25.551 ? ?POST-OPERATIVE DIAGNOSIS:  Primary osteoarthritis of right hip  M16.11 ?Chronic right hip pain  M25.551 ? ?PROCEDURE:  Procedure(s): ?TOTAL HIP ARTHROPLASTY ANTERIOR APPROACH (Right) ? ?SURGEON: Leitha Schuller, MD ? ?ASSISTANTS: None ? ?ANESTHESIA:   spinal ? ?EBL:  Total I/O ?In: 1000 [I.V.:800; IV Piggyback:200] ?Out: 750 [Urine:600; Blood:150] ? ?BLOOD ADMINISTERED:none ? ?DRAINS:  Incisional wound VAC   ? ?LOCAL MEDICATIONS USED:  MARCAINE    and OTHER Exparel ? ?SPECIMEN: Right femoral head ? ?DISPOSITION OF SPECIMEN:  PATHOLOGY ? ?COUNTS:  YES ? ?TOURNIQUET:  * No tourniquets in log * ? ?IMPLANTS: Medacta Masterloc 7 LAT plus stem with ceramic L 28 mm head, 58 mm Mpact DM cup and liner ? ?DICTATION: .Dragon Dictation   The patient was brought to the operating room and after spinal anesthesia was obtained patient was placed on the operative table with the ipsilateral foot into the Medacta attachment, contralateral leg on a well-padded table. C-arm was brought in and preop template x-ray taken. After prepping and draping in usual sterile fashion appropriate patient identification and timeout procedures were completed. Anterior approach to the hip was obtained and centered over the greater trochanter and TFL muscle. The subcutaneous tissue was incised hemostasis being achieved by electrocautery. TFL fascia was incised and the muscle retracted laterally deep retractor placed. The lateral femoral circumflex vessels were identified and ligated. The anterior capsule was exposed and a capsulotomy performed. The neck was identified and a femoral neck cut carried out with a saw. The head was removed without difficulty and showed sclerotic femoral head and acetabulum. Reaming was carried out to 58 mm and a 58 mm cup trial gave appropriate tightness  to the acetabular component a 58 DM cup was impacted into position. The leg was then externally rotated and ischiofemoral and pubofemoral releases carried out. The femur was sequentially broached to a size 7, size 7 lateralized stem with S head trials were placed and the final components chosen. The 7 lateralized plus stem was inserted along with a ceramic L 28 mm head and 58 mm liner. The hip was reduced and was stable the wound was thoroughly irrigated with fibrillar placed along the posterior capsule and medial neck. The deep fascia ws closed using a heavy Quill after infiltration of 30 cc of quarter percent Sensorcaine with epinephrine diluted with Exparel throughout the case .3-0 V-loc to close the skin with skin staples.  Incisional wound VAC applied and patient was sent to recovery in stable condition.  ? ?PLAN OF CARE: Admit for overnight observation ? ?

## 2021-08-17 NOTE — Anesthesia Procedure Notes (Signed)
Spinal ? ?Patient location during procedure: OR ?Start time: 08/17/2021 12:32 PM ?End time: 08/17/2021 12:39 PM ?Reason for block: surgical anesthesia ?Staffing ?Performed: resident/CRNA  ?Resident/CRNA: Loletha Grayer, CRNA ?Preanesthetic Checklist ?Completed: patient identified, IV checked, site marked, risks and benefits discussed, surgical consent, monitors and equipment checked, pre-op evaluation and timeout performed ?Spinal Block ?Patient position: sitting ?Prep: Betadine ?Patient monitoring: heart rate, continuous pulse ox, blood pressure and cardiac monitor ?Approach: midline ?Location: L4-5 ?Injection technique: single-shot ?Needle ?Needle type: Introducer and Pencan  ?Needle gauge: 24 G ?Needle length: 9 cm ?Assessment ?Events: CSF return ?Additional Notes ?Negative paresthesia. Negative blood return. Positive free-flowing CSF. Expiration date of kit checked and confirmed. Patient tolerated procedure well, without complications. ? ? ? ? ? ?

## 2021-08-17 NOTE — Anesthesia Preprocedure Evaluation (Signed)
Anesthesia Evaluation  ?Patient identified by MRN, date of birth, ID band ?Patient awake ? ? ? ?Reviewed: ?Allergy & Precautions, NPO status , Patient's Chart, lab work & pertinent test results ? ?History of Anesthesia Complications ?Negative for: history of anesthetic complications ? ?Airway ?Mallampati: III ? ?TM Distance: >3 FB ?Neck ROM: Full ? ? ? Dental ?no notable dental hx. ?(+) Teeth Intact ?  ?Pulmonary ?neg pulmonary ROS, neg sleep apnea, neg COPD, Patient abstained from smoking.Not current smoker, former smoker,  ?  ?Pulmonary exam normal ?breath sounds clear to auscultation ? ? ? ? ? ? Cardiovascular ?Exercise Tolerance: Good ?METShypertension, Pt. on medications ?(-) CAD and (-) Past MI (-) dysrhythmias  ?Rhythm:Regular Rate:Normal ?- Systolic murmurs ? ?  ?Neuro/Psych ?negative neurological ROS ? negative psych ROS  ? GI/Hepatic ?neg GERD  ,(+)  ?  ? (-) substance abuse ? ,   ?Endo/Other  ?neg diabetes ? Renal/GU ?negative Renal ROS  ? ?  ?Musculoskeletal ? ?(+) Arthritis ,  ? Abdominal ?  ?Peds ? Hematology ?  ?Anesthesia Other Findings ?Past Medical History: ?No date: Arthritis ?No date: Hypertension ? Reproductive/Obstetrics ? ?  ? ? ? ? ? ? ? ? ? ? ? ? ? ?  ?  ? ? ? ? ? ? ? ? ?Anesthesia Physical ?Anesthesia Plan ? ?ASA: 2 ? ?Anesthesia Plan: Spinal  ? ?Post-op Pain Management: Ofirmev IV (intra-op)*  ? ?Induction: Intravenous ? ?PONV Risk Score and Plan: 1 and Ondansetron, Dexamethasone, Propofol infusion, TIVA and Midazolam ? ?Airway Management Planned: Natural Airway ? ?Additional Equipment: None ? ?Intra-op Plan:  ? ?Post-operative Plan:  ? ?Informed Consent: I have reviewed the patients History and Physical, chart, labs and discussed the procedure including the risks, benefits and alternatives for the proposed anesthesia with the patient or authorized representative who has indicated his/her understanding and acceptance.  ? ? ? ? ? ?Plan Discussed with: CRNA  and Surgeon ? ?Anesthesia Plan Comments: (Discussed R/B/A of neuraxial anesthesia technique with patient: ?- rare risks of spinal/epidural hematoma, nerve damage, infection ?- Risk of PDPH ?- Risk of nausea and vomiting ?- Risk of conversion to general anesthesia and its associated risks, including sore throat, damage to lips/eyes/teeth/oropharynx, and rare risks such as cardiac and respiratory events. ?- Risk of allergic reactions ? ?Discussed the role of CRNA in patient's perioperative care. ? ?Patient voiced understanding.)  ? ? ? ? ? ? ?Anesthesia Quick Evaluation ? ?

## 2021-08-18 DIAGNOSIS — M1611 Unilateral primary osteoarthritis, right hip: Secondary | ICD-10-CM | POA: Diagnosis not present

## 2021-08-18 MED ORDER — TRAMADOL HCL 50 MG PO TABS: 50.0000 mg | ORAL_TABLET | Freq: Four times a day (QID) | ORAL | 0 refills | Status: DC

## 2021-08-18 MED ORDER — ENOXAPARIN SODIUM 40 MG/0.4ML IJ SOSY: 40.0000 mg | PREFILLED_SYRINGE | INTRAMUSCULAR | 0 refills | Status: DC

## 2021-08-18 MED ORDER — OXYCODONE HCL 5 MG PO TABS: 5.0000 mg | ORAL_TABLET | ORAL | 0 refills | Status: DC | PRN

## 2021-08-18 NOTE — Discharge Instructions (Signed)
ANTERIOR APPROACH TOTAL HIP REPLACEMENT POSTOPERATIVE DIRECTIONS ? ? ?Hip Rehabilitation, Guidelines Following Surgery  ?The results of a hip operation are greatly improved after range of motion and muscle strengthening exercises. Follow all safety measures which are given to protect your hip. If any of these exercises cause increased pain or swelling in your joint, decrease the amount until you are comfortable again. Then slowly increase the exercises. Call your caregiver if you have problems or questions.  ? ?HOME CARE INSTRUCTIONS  ?Remove items at home which could result in a fall. This includes throw rugs or furniture in walking pathways.  ?ICE to the affected hip every three hours for 30 minutes at a time and then as needed for pain and swelling.  Continue to use ice on the hip for pain and swelling from surgery. You may notice swelling that will progress down to the foot and ankle.  This is normal after surgery.  Elevate the leg when you are not up walking on it.   ?Continue to use the breathing machine which will help keep your temperature down.  It is common for your temperature to cycle up and down following surgery, especially at night when you are not up moving around and exerting yourself.  The breathing machine keeps your lungs expanded and your temperature down. ?Do not place pillow under knee, focus on keeping the knee straight while resting ? ?DIET ?You may resume your previous home diet once your are discharged from the hospital. ? ?DRESSING / WOUND CARE / SHOWERING ?The wound VAC will remain in place for 5 to 7 days.  After that time.  The alarm will begin to sound.  The wound VAC can be removed and a dry dressing can be applied. ?Keep your dressing dry with showering.  You can keep it covered and pat dry. ?Change the surgical dressing daily and reapply a dry dressing each time. ? ?ACTIVITY ?Walk with your walker as instructed. ?Use walker as long as suggested by your caregivers. ?Avoid periods of  inactivity such as sitting longer than an hour when not asleep. This helps prevent blood clots.  ?You may resume a sexual relationship in one month or when given the OK by your doctor.  ?You may return to work once you are cleared by your doctor.  ?Do not drive a car for 6 weeks or until released by you surgeon.  ?Do not drive while taking narcotics. ? ?WEIGHT BEARING ?Weight bearing as tolerated with assist device (walker, cane, etc) as directed, use it as long as suggested by your surgeon or therapist, typically at least 4-6 weeks. ? ?POSTOPERATIVE CONSTIPATION PROTOCOL ?Constipation - defined medically as fewer than three stools per week and severe constipation as less than one stool per week. ? ?One of the most common issues patients have following surgery is constipation.  Even if you have a regular bowel pattern at home, your normal regimen is likely to be disrupted due to multiple reasons following surgery.  Combination of anesthesia, postoperative narcotics, change in appetite and fluid intake all can affect your bowels.  In order to avoid complications following surgery, here are some recommendations in order to help you during your recovery period. ? ?Colace (docusate) - Pick up an over-the-counter form of Colace or another stool softener and take twice a day as long as you are requiring postoperative pain medications.  Take with a full glass of water daily.  If you experience loose stools or diarrhea, hold the colace until you stool  forms back up.  If your symptoms do not get better within 1 week or if they get worse, check with your doctor. ? ?Dulcolax (bisacodyl) - Pick up over-the-counter and take as directed by the product packaging as needed to assist with the movement of your bowels.  Take with a full glass of water.  Use this product as needed if not relieved by Colace only.  ? ?MiraLax (polyethylene glycol) - Pick up over-the-counter to have on hand.  MiraLax is a solution that will increase the  amount of water in your bowels to assist with bowel movements.  Take as directed and can mix with a glass of water, juice, soda, coffee, or tea.  Take if you go more than two days without a movement. ?Do not use MiraLax more than once per day. Call your doctor if you are still constipated or irregular after using this medication for 7 days in a row. ? ?If you continue to have problems with postoperative constipation, please contact the office for further assistance and recommendations.  If you experience "the worst abdominal pain ever" or develop nausea or vomiting, please contact the office immediatly for further recommendations for treatment. ? ?ITCHING ? If you experience itching with your medications, try taking only a single pain pill, or even half a pain pill at a time.  You can also use Benadryl over the counter for itching or also to help with sleep.  ? ?TED HOSE STOCKINGS ?Wear the elastic stockings on both legs for three weeks following surgery during the day but you may remove then at night for sleeping. ? ?MEDICATIONS ?See your medication summary on the ?After Visit Summary? that the nursing staff will review with you prior to discharge.  You may have some home medications which will be placed on hold until you complete the course of blood thinner medication.  It is important for you to complete the blood thinner medication as prescribed by your surgeon.  Continue your approved medications as instructed at time of discharge. ? ?PRECAUTIONS ?If you experience chest pain or shortness of breath - call 911 immediately for transfer to the hospital emergency department.  ?If you develop a fever greater that 101 F, purulent drainage from wound, increased redness or drainage from wound, foul odor from the wound/dressing, or calf pain - CONTACT YOUR SURGEON.   ?                                                ?FOLLOW-UP APPOINTMENTS ?Make sure you keep all of your appointments after your operation with your  surgeon and caregivers. You should call the office at the above phone number and make an appointment for approximately two weeks after the date of your surgery or on the date instructed by your surgeon outlined in the "After Visit Summary". ? ?RANGE OF MOTION AND STRENGTHENING EXERCISES  ?These exercises are designed to help you keep full movement of your hip joint. Follow your caregiver's or physical therapist's instructions. Perform all exercises about fifteen times, three times per day or as directed. Exercise both hips, even if you have had only one joint replacement. These exercises can be done on a training (exercise) mat, on the floor, on a table or on a bed. Use whatever works the best and is most comfortable for you. Use music or television while you are  exercising so that the exercises are a pleasant break in your day. This will make your life better with the exercises acting as a break in routine you can look forward to.  ?Lying on your back, slowly slide your foot toward your buttocks, raising your knee up off the floor. Then slowly slide your foot back down until your leg is straight again.  ?Lying on your back spread your legs as far apart as you can without causing discomfort.  ?Lying on your side, raise your upper leg and foot straight up from the floor as far as is comfortable. Slowly lower the leg and repeat.  ?Lying on your back, tighten up the muscle in the front of your thigh (quadriceps muscles). You can do this by keeping your leg straight and trying to raise your heel off the floor. This helps strengthen the largest muscle supporting your knee.  ?Lying on your back, tighten up the muscles of your buttocks both with the legs straight and with the knee bent at a comfortable angle while keeping your heel on the floor.  ? ?IF YOU ARE TRANSFERRED TO A SKILLED REHAB FACILITY ?If the patient is transferred to a skilled rehab facility following release from the hospital, a list of the current  medications will be sent to the facility for the patient to continue.  When discharged from the skilled rehab facility, please have the facility set up the patient's Home Health Physical Therapy prior to being rel

## 2021-08-18 NOTE — TOC Transition Note (Signed)
Transition of Care (TOC) - CM/SW Discharge Note ? ? ?Patient Details  ?Name: Larry Michael ?MRN: [id number] ?Date of Birth: 1960-07-19 ? ?Transition of Care (TOC) CM/SW Contact:  ?Bing Quarry, RN ?Phone Number: ?08/18/2021, 9:45 AM ? ? ?Clinical Narrative:  3/25: Patient has discharge orders to today. Outpatient therapy to be arranged via ortho office per Marcelline Mates PA due to difficulty obtaining [facility] agency with patient's insurance. Gabriel Cirri RN CM   ? ? ? ?Final next level of care: OP Rehab ?Barriers to Discharge: Barriers Resolved ? ? ?Patient Goals and CMS Choice ?  ?  ?  ? ?Discharge Placement ?  ?           ?  ?  ?  ?  ? ?Discharge Plan and Services ?  ?  ?           ?DME Arranged: N/A ?DME Agency: NA ?  ?  ?  ?HH Arranged: NA (outpatient will be arranged via ortho office per PA Mun[place]) ?  ?  ?  ?  ? ?Social Determinants of Health (SDOH) Interventions ?  ? ? ?Readmission Risk Interventions ?   ? View : No data to display.  ?  ?  ?  ? ? ? ? ? ?

## 2021-08-18 NOTE — Progress Notes (Signed)
?  Subjective: ?1 Day Post-Op Procedure(s) (LRB): ?TOTAL HIP ARTHROPLASTY ANTERIOR APPROACH (Right) ?Patient reports pain as mild.   ?Patient is well, and has had no acute complaints or problems ?Plan is to go Home after hospital stay. ?Negative for chest pain and shortness of breath ?Fever: no ?Gastrointestinal: Negative for nausea and vomiting ? ?Objective: ?Vital signs in last 24 hours: ?Temp:  [97.4 ?F (36.3 ?C)-98.3 ?F (36.8 ?C)] 98.3 ?F (36.8 ?C) (03/25 [id number]) ?Pulse Rate:  [64-93] 92 (03/25 0307) ?Resp:  [12-20] 14 (03/25 0307) ?BP: (114-157)/(68-92) 150/80 (03/25 0307) ?SpO2:  [98 %-100 %] 98 % (03/25 0307) ?Weight:  [113.4 kg] 113.4 kg (03/24 1115) ? ?Intake/Output from previous day: ? ?Intake/Output Summary (Last 24 hours) at 08/18/2021 0725 ?Last data filed at 08/18/2021 0308 ?Gross per 24 hour  ?Intake 1640 ml  ?Output 1300 ml  ?Net 340 ml  ?  ?Intake/Output this shift: ?No intake/output data recorded. ? ?Labs: ?Recent Labs  ?  08/17/21 ?1714  ?HGB 13.2  ? ?Recent Labs  ?  08/17/21 ?1714  ?WBC 11.7*  ?RBC 4.23  ?HCT 38.8*  ?PLT 247  ? ?Recent Labs  ?  08/17/21 ?1714  ?CREATININE 0.85  ? ?No results for input(s): LABPT, INR in the last 72 hours. ? ? ?EXAM ?General - Patient is Alert and Oriented ?Extremity - Neurovascular intact ?Sensation intact distally ?Dorsiflexion/Plantar flexion intact ?Compartment soft ?Dressing/Incision - clean, dry, with the wound VAC in place ?Motor Function - intact, moving foot and toes well on exam.  Ambulated to the chair. ? ?Past Medical History:  ?Diagnosis Date  ? Arthritis   ? Hypertension   ? ? ?Assessment/Plan: ?1 Day Post-Op Procedure(s) (LRB): ?TOTAL HIP ARTHROPLASTY ANTERIOR APPROACH (Right) ?Principal Problem: ?  Status post total hip replacement, right ? ?Estimated body mass index is 35.87 kg/m? as calculated from the following: ?  Height as of this encounter: 5\' 10"  (1.778 m). ?  Weight as of this encounter: 113.4 kg. ?Advance diet ?Up with therapy ?D/C IV  fluids ?Discharge home with home health ? ?DVT Prophylaxis - Lovenox, Foot Pumps, and TED hose ?Weight-Bearing as tolerated to right leg ? ?Reche Dixon, PA-C ?Orthopaedic Surgery ?08/18/2021, 7:25 AM ? ?

## 2021-08-18 NOTE — Plan of Care (Signed)
?  Problem: Education: ?Goal: Knowledge of General Education information will improve ?Description: Including pain rating scale, medication(s)/side effects and non-pharmacologic comfort measures ?Outcome: Progressing ?  ?Problem: Health Behavior/Discharge Planning: ?Goal: Ability to manage health-related needs will improve ?Outcome: Progressing ?  ?Problem: Clinical Measurements: ?Goal: Will remain free from infection ?Outcome: Progressing ?  ?Problem: Education: ?Goal: Knowledge of General Education information will improve ?Description: Including pain rating scale, medication(s)/side effects and non-pharmacologic comfort measures ?Outcome: Progressing ?  ?Problem: Health Behavior/Discharge Planning: ?Goal: Ability to manage health-related needs will improve ?Outcome: Progressing ?  ?Problem: Clinical Measurements: ?Goal: Ability to maintain clinical measurements within normal limits will improve ?Outcome: Progressing ?Goal: Will remain free from infection ?Outcome: Progressing ?Goal: Diagnostic test results will improve ?Outcome: Progressing ?Goal: Respiratory complications will improve ?Outcome: Progressing ?Goal: Cardiovascular complication will be avoided ?Outcome: Progressing ?  ?Problem: Activity: ?Goal: Risk for activity intolerance will decrease ?Outcome: Progressing ?  ?Problem: Nutrition: ?Goal: Adequate nutrition will be maintained ?Outcome: Progressing ?  ?Problem: Coping: ?Goal: Level of anxiety will decrease ?Outcome: Progressing ?  ?Problem: Elimination: ?Goal: Will not experience complications related to bowel motility ?Outcome: Progressing ?Goal: Will not experience complications related to urinary retention ?Outcome: Progressing ?  ?Problem: Pain Managment: ?Goal: General experience of comfort will improve ?Outcome: Progressing ?  ?Problem: Safety: ?Goal: Ability to remain free from injury will improve ?Outcome: Progressing ?  ?Problem: Skin Integrity: ?Goal: Risk for impaired skin integrity will  decrease ?Outcome: Progressing ?  ?Problem: Education: ?Goal: Knowledge of the prescribed therapeutic regimen will improve ?Outcome: Progressing ?Goal: Understanding of discharge needs will improve ?Outcome: Progressing ?Goal: Individualized Educational Video(s) ?Outcome: Progressing ?  ?Problem: Activity: ?Goal: Ability to avoid complications of mobility impairment will improve ?Outcome: Progressing ?Goal: Ability to tolerate increased activity will improve ?Outcome: Progressing ?  ?Problem: Clinical Measurements: ?Goal: Postoperative complications will be avoided or minimized ?Outcome: Progressing ?  ?Problem: Pain Management: ?Goal: Pain level will decrease with appropriate interventions ?Outcome: Progressing ?  ?Problem: Skin Integrity: ?Goal: Will show signs of wound healing ?Outcome: Progressing ?  ?

## 2021-08-18 NOTE — Progress Notes (Signed)
Physical Therapy Treatment ?Patient Details ?Name: Larry Michael ?MRN: [id number] ?DOB: [birth year] ?Today's Date: 08/18/2021 ? ? ?History of Present Illness Pt is a 61 yo M with PMH of HTN who was diagnosed with primary osteoarthritis of right hip and is now s/p elective R THA. ? ?  ?PT Comments  ? ? Pt was pleasant and motivated to participate during the session and put forth good effort throughout. Pt required min A to manage his RLE during sup to sit this session likely secondary to pain.  Pt also presented with increased difficulty advancing his RLE initially during gait and was quite antalgic on the RLE but gait quality improved significantly per below as the session progressed.  Pt demonstrated very good control and stability during stair training under several conditions per below.  No adverse symptoms noted during the session other than R hip pain with SpO2 and HR WNL. Pt will benefit from HHPT upon discharge to safely address deficits listed in patient problem list for decreased caregiver assistance and eventual return to PLOF. ?  ?   ?Recommendations for follow up therapy are one component of a multi-disciplinary discharge planning process, led by the attending physician.  Recommendations may be updated based on patient status, additional functional criteria and insurance authorization. ? ?Follow Up Recommendations ? Home health PT ?  ?  ?Assistance Recommended at Discharge Intermittent Supervision/Assistance  ?Patient can return home with the following A little help with walking and/or transfers;A little help with bathing/dressing/bathroom;Assistance with cooking/housework;Help with stairs or ramp for entrance;Assist for transportation ?  ?Equipment Recommendations ? None recommended by PT  ?  ?Recommendations for Other Services   ? ? ?  ?Precautions / Restrictions Precautions ?Precautions: Anterior Hip;Fall ?Precaution Booklet Issued: Yes (comment) ?Restrictions ?Weight Bearing Restrictions: Yes ?RLE  Weight Bearing: Weight bearing as tolerated  ?  ? ?Mobility ? Bed Mobility ?Overal bed mobility: Needs Assistance ?  ?  ?  ?Supine to sit: Min assist ?  ?  ?General bed mobility comments: Min A for RLE management ?  ? ?Transfers ?Overall transfer level: Needs assistance ?Equipment used: Rolling walker (2 wheels) ?Transfers: Sit to/from Stand ?Sit to Stand: Min guard ?  ?  ?  ?  ?  ?General transfer comment: Min verbal cues for hand placement; good control and stability ?  ? ?Ambulation/Gait ?Ambulation/Gait assistance: Min guard ?Gait Distance (Feet): 150 Feet ?Assistive device: Rolling walker (2 wheels) ?Gait Pattern/deviations: Step-to pattern, Antalgic, Decreased stance time - right, Trunk flexed, Step-through pattern ?Gait velocity: decreased ?  ?  ?General Gait Details: Slow step-to cadence with heavy lean on the RW and some difficulty advancing the RLE initially but as ambulation progressed pt was able to slowly transition to a step-through pattern with minimal BUE WB through the RW and with improved cadence; no LOB during the session ? ? ?Stairs ?Stairs: Yes ?Stairs assistance: Min guard ?Stair Management: Backwards, Forwards, Step to pattern, One rail Right, With walker ?Number of Stairs: 4 ?General stair comments: Pt with multiple stair configurations at home with practice performed ascending one step backwards/descending forwards with a RW, asending one step with a L rail and ascending 4 steps with a R rail; pt's sister present for training.  Pt steady with good eccentric and concentric control and stability and good carryover of proper sequencing; visual demonstration, teach back, and pt practice all utilized ? ? ?Wheelchair Mobility ?  ? ?Modified Rankin (Stroke Patients Only) ?  ? ? ?  ?Balance Overall balance assessment:  Needs assistance ?Sitting-balance support: No upper extremity supported, Feet supported ?Sitting balance-Leahy Scale: Normal ?  ?  ?Standing balance support: Bilateral upper extremity  supported, During functional activity ?Standing balance-Leahy Scale: Good ?  ?  ?  ?  ?  ?  ?  ?  ?  ?  ?  ?  ?  ? ?  ?Cognition Arousal/Alertness: Awake/alert ?Behavior During Therapy: [facility] for tasks assessed/performed ?Overall Cognitive Status: Within Functional Limits for tasks assessed ?  ?  ?  ?  ?  ?  ?  ?  ?  ?  ?  ?  ?  ?  ?  ?  ?  ?  ?  ? ?  ?Exercises Other Exercises ?Other Exercises: HEP review per handout ?Other Exercises: Car transfer sequencing education with pt and sister ?Other Exercises: General concepts of gradual activity progression discussed with pt ? ?  ?General Comments   ?  ?  ? ?Pertinent Vitals/Pain Pain Assessment ?Pain Assessment: 0-10 ?Pain Score: 5  ?Pain Location: R hip ?Pain Descriptors / Indicators: Sore ?Pain Intervention(s): Repositioned, Premedicated before session, Monitored during session  ? ? ?Home Living   ?  ?  ?  ?  ?  ?  ?  ?  ?  ?   ?  ?Prior Function    ?  ?  ?   ? ?PT Goals (current goals can now be found in the care plan section) Progress towards PT goals: Progressing toward goals ? ?  ?Frequency ? ? ? BID ? ? ? ?  ?PT Plan Current plan remains appropriate  ? ? ?Co-evaluation   ?  ?  ?  ?  ? ?  ?AM-PAC PT "6 Clicks" Mobility   ?Outcome Measure ? Help needed turning from your back to your side while in a flat bed without using bedrails?: A Little ?Help needed moving from lying on your back to sitting on the side of a flat bed without using bedrails?: A Little ?Help needed moving to and from a bed to a chair (including a wheelchair)?: A Little ?Help needed standing up from a chair using your arms (e.g., wheelchair or bedside chair)?: A Little ?Help needed to walk in hospital room?: A Little ?Help needed climbing 3-5 steps with a railing? : A Little ?6 Click Score: 18 ? ?  ?End of Session Equipment Utilized During Treatment: Gait belt ?Activity Tolerance: Patient tolerated treatment well ?Patient left: in chair;with call bell/phone within reach;with chair alarm set ?Nurse  Communication: Mobility status;Weight bearing status ?PT Visit Diagnosis: Other abnormalities of gait and mobility (R26.89);Muscle weakness (generalized) (M62.81);Pain ?Pain - Right/Left: Right ?Pain - part of body: Hip ?  ? ? ?Time: 6389-3734 ?PT Time Calculation (min) (ACUTE ONLY): 39 min ? ?Charges:  $Gait Training: 23-37 mins ?$Therapeutic Activity: 8-22 mins          ?          ? ?D. Elly Modena PT, DPT ?08/18/21, 11:17 AM ? ? ?

## 2021-08-18 NOTE — Plan of Care (Signed)
?Problem: Education: ?Goal: Knowledge of General Education information will improve ?Description: Including pain rating scale, medication(s)/side effects and non-pharmacologic comfort measures ?08/18/2021 1053 by Freda Munro, Deon'Tajisha L, LPN ?Outcome: Adequate for Discharge ?08/18/2021 0949 by Enzo Bi, LPN ?Outcome: Progressing ?  ?Problem: Health Behavior/Discharge Planning: ?Goal: Ability to manage health-related needs will improve ?08/18/2021 1053 by Freda Munro, Deon'Tajisha L, LPN ?Outcome: Adequate for Discharge ?08/18/2021 0949 by Enzo Bi, LPN ?Outcome: Progressing ?  ?Problem: Clinical Measurements: ?Goal: Ability to maintain clinical measurements within normal limits will improve ?08/18/2021 1053 by Freda Munro, Deon'Tajisha L, LPN ?Outcome: Adequate for Discharge ?08/18/2021 0949 by Enzo Bi, LPN ?Outcome: Progressing ?Goal: Will remain free from infection ?08/18/2021 1053 by Freda Munro, Deon'Tajisha L, LPN ?Outcome: Adequate for Discharge ?08/18/2021 0949 by Enzo Bi, LPN ?Outcome: Progressing ?Goal: Diagnostic test results will improve ?08/18/2021 1053 by Freda Munro, Deon'Tajisha L, LPN ?Outcome: Adequate for Discharge ?08/18/2021 0949 by Enzo Bi, LPN ?Outcome: Progressing ?Goal: Respiratory complications will improve ?08/18/2021 1053 by Freda Munro, Deon'Tajisha L, LPN ?Outcome: Adequate for Discharge ?08/18/2021 0949 by Enzo Bi, LPN ?Outcome: Progressing ?Goal: Cardiovascular complication will be avoided ?08/18/2021 1053 by Freda Munro, Deon'Tajisha L, LPN ?Outcome: Adequate for Discharge ?08/18/2021 0949 by Enzo Bi, LPN ?Outcome: Progressing ?  ?Problem: Activity: ?Goal: Risk for activity intolerance will decrease ?08/18/2021 1053 by Freda Munro, Deon'Tajisha L, LPN ?Outcome: Adequate for Discharge ?08/18/2021 0949 by Enzo Bi, LPN ?Outcome: Progressing ?  ?Problem: Nutrition: ?Goal: Adequate nutrition will be maintained ?08/18/2021  1053 by Freda Munro, Deon'Tajisha L, LPN ?Outcome: Adequate for Discharge ?08/18/2021 0949 by Enzo Bi, LPN ?Outcome: Progressing ?  ?Problem: Coping: ?Goal: Level of anxiety will decrease ?08/18/2021 1053 by Freda Munro, Deon'Tajisha L, LPN ?Outcome: Adequate for Discharge ?08/18/2021 0949 by Enzo Bi, LPN ?Outcome: Progressing ?  ?Problem: Elimination: ?Goal: Will not experience complications related to bowel motility ?08/18/2021 1053 by Freda Munro, Deon'Tajisha L, LPN ?Outcome: Adequate for Discharge ?08/18/2021 0949 by Enzo Bi, LPN ?Outcome: Progressing ?Goal: Will not experience complications related to urinary retention ?08/18/2021 1053 by Freda Munro, Deon'Tajisha L, LPN ?Outcome: Adequate for Discharge ?08/18/2021 0949 by Enzo Bi, LPN ?Outcome: Progressing ?  ?Problem: Pain Managment: ?Goal: General experience of comfort will improve ?08/18/2021 1053 by Freda Munro, Deon'Tajisha L, LPN ?Outcome: Adequate for Discharge ?08/18/2021 0949 by Enzo Bi, LPN ?Outcome: Progressing ?  ?Problem: Safety: ?Goal: Ability to remain free from injury will improve ?08/18/2021 1053 by Freda Munro, Deon'Tajisha L, LPN ?Outcome: Adequate for Discharge ?08/18/2021 0949 by Enzo Bi, LPN ?Outcome: Progressing ?  ?Problem: Skin Integrity: ?Goal: Risk for impaired skin integrity will decrease ?08/18/2021 1053 by Freda Munro, Deon'Tajisha L, LPN ?Outcome: Adequate for Discharge ?08/18/2021 0949 by Enzo Bi, LPN ?Outcome: Progressing ?  ?Problem: Education: ?Goal: Knowledge of the prescribed therapeutic regimen will improve ?08/18/2021 1053 by Freda Munro, Deon'Tajisha L, LPN ?Outcome: Adequate for Discharge ?08/18/2021 0949 by Enzo Bi, LPN ?Outcome: Progressing ?Goal: Understanding of discharge needs will improve ?08/18/2021 1053 by Freda Munro, Deon'Tajisha L, LPN ?Outcome: Adequate for Discharge ?08/18/2021 0949 by Enzo Bi, LPN ?Outcome: Progressing ?Goal:  Individualized Educational Video(s) ?08/18/2021 1053 by Freda Munro, Deon'Tajisha L, LPN ?Outcome: Adequate for Discharge ?08/18/2021 0949 by Enzo Bi, LPN ?Outcome: Progressing ?  ?Problem: Activity: ?Goal: Ability to avoid complications of mobility impairment will improve ?08/18/2021 1053 by Freda Munro, Deon'Tajisha L, LPN ?Outcome: Adequate for Discharge ?08/18/2021 0949 by Enzo Bi, LPN ?Outcome: Progressing ?Goal: Ability to tolerate increased activity will improve ?08/18/2021 1053 by Freda Munro, Deon'Tajisha L, LPN ?  Outcome: Adequate for Discharge ?08/18/2021 0949 by Enzo Bi, LPN ?Outcome: Progressing ?  ?Problem: Clinical Measurements: ?Goal: Postoperative complications will be avoided or minimized ?08/18/2021 1053 by Freda Munro, Deon'Tajisha L, LPN ?Outcome: Adequate for Discharge ?08/18/2021 0949 by Enzo Bi, LPN ?Outcome: Progressing ?  ?Problem: Pain Management: ?Goal: Pain level will decrease with appropriate interventions ?08/18/2021 1053 by Freda Munro, Deon'Tajisha L, LPN ?Outcome: Adequate for Discharge ?08/18/2021 0949 by Enzo Bi, LPN ?Outcome: Progressing ?  ?Problem: Skin Integrity: ?Goal: Will show signs of wound healing ?08/18/2021 1053 by Freda Munro, Deon'Tajisha L, LPN ?Outcome: Adequate for Discharge ?08/18/2021 0949 by Enzo Bi, LPN ?Outcome: Progressing ?  ?

## 2021-08-20 ENCOUNTER — Encounter: Payer: Self-pay | Admitting: Orthopedic Surgery

## 2021-08-20 NOTE — Anesthesia Postprocedure Evaluation (Signed)
Anesthesia Post Note ? ?Patient: Larry Michael ? ?Procedure(s) Performed: TOTAL HIP ARTHROPLASTY ANTERIOR APPROACH (Right: Hip) ? ?Patient location during evaluation: Nursing Unit ?Anesthesia Type: Spinal ?Level of consciousness: oriented and awake and alert ?Pain management: pain level controlled ?Vital Signs Assessment: post-procedure vital signs reviewed and stable ?Respiratory status: spontaneous breathing ?Cardiovascular status: blood pressure returned to baseline and stable ?Postop Assessment: no headache, no backache, no apparent nausea or vomiting and patient able to bend at knees ?Anesthetic complications: no ? ? ?No notable events documented. ? ? ?Last Vitals:  ?Vitals:  ? 08/18/21 0307 08/18/21 0750  ?BP: (!) 150/80 (!) 147/78  ?Pulse: 92 80  ?Resp: 14 16  ?Temp: 36.8 ?C 36.9 ?C  ?SpO2: 98% 99%  ?  ?Last Pain:  ?Vitals:  ? 08/18/21 0856  ?TempSrc:   ?PainSc: 0-No pain  ? ? ?  ?  ?  ?  ?  ?  ? ?Cleda Mccreedy Arella Blinder ? ? ? ? ?

## 2021-08-21 ENCOUNTER — Encounter: Payer: Self-pay | Admitting: Orthopedic Surgery

## 2021-08-21 LAB — SURGICAL PATHOLOGY

## 2021-08-30 NOTE — Discharge Summary (Signed)
Physician Discharge Summary  ?Subjective: ?13 Days Post-Op Procedure(s) (LRB): ?TOTAL HIP ARTHROPLASTY ANTERIOR APPROACH (Right) ?Patient reports pain as mild.   ?Patient seen in rounds with Dr. Rosita Kea. ?Patient is well, and has had no acute complaints or problems ?Patient is ready to go home after physical therapy ? ?Physician Discharge Summary  ?Patient ID: ?Larry Michael ?MRN: [id number] ?DOB/AGE: [birth year] 61 y.o. ? ?Admit date: 08/17/2021 ?Discharge date: 08/30/2021 ? ?Admission Diagnoses: ? ?Discharge Diagnoses:  ?Principal Problem: ?  Status post total hip replacement, right ? ? ?Discharged Condition: good ? ?Hospital Course: The patient is postop day 1 from a right anterior total hip replacement.  He is done well since surgery.  His pain is manageable.  His vitals have remained stable.  The patient has done physical therapy and is ready to go home with home health physical therapy. ? ?Treatments: surgery:  ?TOTAL HIP ARTHROPLASTY ANTERIOR APPROACH (Right) ?  ?SURGEON: Leitha Schuller, MD ?  ?ASSISTANTS: None ?  ?ANESTHESIA:   spinal ?  ?EBL:  Total I/O ?In: 1000 [I.V.:800; IV Piggyback:200] ?Out: 750 [Urine:600; Blood:150] ?  ?BLOOD ADMINISTERED:none ?  ?DRAINS:  Incisional wound VAC   ?  ?LOCAL MEDICATIONS USED:  MARCAINE    and OTHER Exparel ?  ?SPECIMEN: Right femoral head ?  ?DISPOSITION OF SPECIMEN:  PATHOLOGY ?  ?COUNTS:  YES ?  ?TOURNIQUET:  * No tourniquets in log * ?  ?IMPLANTS: Medacta Masterloc 7 LAT plus stem with ceramic L 28 mm head, 58 mm Mpact DM cup and liner ? ?Discharge Exam: ?Blood pressure (!) 147/78, pulse 80, temperature 98.5 ?F (36.9 ?C), resp. rate 16, height 5\' 10"  (1.778 m), weight 113.4 kg, SpO2 99 %. ? ? ?Disposition: Discharge disposition: 01-Home or Self Care ? ? ? ? ? ? ? ?Allergies as of 08/18/2021   ?No Known Allergies ?  ? ?  ?Medication List  ?  ? ?TAKE these medications   ? ?acetaminophen 500 MG tablet ?Commonly known as: TYLENOL ?Take 1,000 mg by mouth daily. ?  ?amLODipine  5 MG tablet ?Commonly known as: NORVASC ?Take 5 mg by mouth daily. ?  ?enoxaparin 40 MG/0.4ML injection ?Commonly known as: LOVENOX ?Inject 0.4 mLs (40 mg total) into the skin daily for 14 days. ?  ?lisinopril 40 MG tablet ?Commonly known as: ZESTRIL ?Take 40 mg by mouth daily. ?  ?multivitamin with minerals Tabs tablet ?Take 1 tablet by mouth daily. ?  ?oxyCODONE 5 MG immediate release tablet ?Commonly known as: Oxy IR/ROXICODONE ?Take 1-2 tablets (5-10 mg total) by mouth every 4 (four) hours as needed for moderate pain (pain score 4-6). ?  ?traMADol 50 MG tablet ?Commonly known as: ULTRAM ?Take 1 tablet (50 mg total) by mouth every 6 (six) hours. ?  ?TURMERIC PO ?Take 1 capsule by mouth daily. ?  ? ?  ? ? Follow-up Information   ? ? 08/20/2021, PA-C Follow up in 2 week(s).   ?Specialties: Orthopedic Surgery, Emergency Medicine ?Why: Care for staple remova and  wound care ?Contact information: ?[address] ?[facility] ?[place] ?[phone] ? ? ?  ?  ? ?  ?  ? ?  ? ? ?Signed: ?Megyn Leng ?08/30/2021, 1:44 PM ? ? ?Objective: ?Vital signs in last 24 hours: ?  ? ?Intake/Output from previous day: ?No intake or output data in the 24 hours ending 08/30/21 1344  ?Intake/Output this shift: ?No intake/output data recorded. ? ?Labs: ?No results for  input(s): HGB in the last 72 hours. ?No results for input(s): WBC, RBC, HCT, PLT in the last 72 hours. ?No results for input(s): NA, K, CL, CO2, BUN, CREATININE, GLUCOSE, CALCIUM in the last 72 hours. ?No results for input(s): LABPT, INR in the last 72 hours. ? ?EXAM: ?General - Patient is Alert and Oriented ?Extremity - Neurovascular intact ?Sensation intact distally ?Dorsiflexion/Plantar flexion intact ?Compartment soft ?Incision - clean, dry, no drainage with the wound VAC in place. ?Motor Function -plantarflexion and dorsiflexion are intact.  Ambulating independently to the bathroom. ? ?Assessment/Plan: ?13 Days Post-Op  Procedure(s) (LRB): ?TOTAL HIP ARTHROPLASTY ANTERIOR APPROACH (Right) ?Procedure(s) (LRB): ?TOTAL HIP ARTHROPLASTY ANTERIOR APPROACH (Right) ?Past Medical History:  ?Diagnosis Date  ? Arthritis   ? Hypertension   ? ?Principal Problem: ?  Status post total hip replacement, right ? ?Estimated body mass index is 35.87 kg/m? as calculated from the following: ?  Height as of this encounter: 5\' 10"  (1.778 m). ?  Weight as of this encounter: 113.4 kg. ?Advance diet ?Up with therapy ?D/C IV fluids ?Discharge home with home health ?Diet - Regular diet ?Follow up - in 2 weeks ?Activity - WBAT ?Disposition - Home ?Condition Upon Discharge - Stable ?DVT Prophylaxis - Lovenox and TED hose ? ? , PA-C ?Orthopaedic Surgery ?08/30/2021, 1:44 PM ? ?

## 2022-08-13 ENCOUNTER — Ambulatory Visit
Admission: EM | Admit: 2022-08-13 | Discharge: 2022-08-13 | Disposition: A | Discharge status: Home / Self Care | Payer: 59 | Attending: Physician Assistant | Admitting: Physician Assistant

## 2022-08-13 DIAGNOSIS — L539 Erythematous condition, unspecified: Secondary | ICD-10-CM

## 2022-08-13 DIAGNOSIS — A46 Erysipelas: Secondary | ICD-10-CM | POA: Diagnosis not present

## 2022-08-13 MED ORDER — METHYLPREDNISOLONE ACETATE 80 MG/ML IJ SUSP
60.0000 mg | Freq: Once | INTRAMUSCULAR | Status: AC
  Administered 2022-08-13: 60 mg via INTRAMUSCULAR

## 2022-08-13 MED ORDER — AMOXICILLIN-POT CLAVULANATE 875-125 MG PO TABS: 1.0000 | ORAL_TABLET | Freq: Two times a day (BID) | ORAL | 0 refills | Status: AC

## 2022-08-13 NOTE — Discharge Instructions (Addendum)
I am not completely convinced that your symptoms are only related to poison ivy.  We gave you an injection of steroids which should help with the poison ivy if that is the cause.  Please continue antihistamines to help with your symptoms (I recommend cetirizine in the morning and famotidine at night which are both available over-the-counter).  Keep area clean with soap and water.  To ensure that there is not an infection causing your symptoms we are going to start an antibiotic.  Take Augmentin twice daily for 7 days.  If you have any worsening or changing symptoms including spread of redness, fever, nausea, vomiting, difficulty swallowing, swelling of your throat you need to go to the emergency room immediately.

## 2022-08-13 NOTE — ED Provider Notes (Signed)
[facility]    CSN: [id number] Arrival date & time: 08/13/22  1622      History   Chief Complaint Chief Complaint  Patient presents with   Poison Ivy    HPI Larry Michael is a 62 y.o. male.   Patient presents today with a 1 day history of erythematous rash involving the right portion of his face.  He denies any changes to personal hygiene products including soaps or detergents.  He does report mild pruritus.  He is concerned that he might of been exposed to poison ivy as his dog was playing outside and then laid on the couch before he laid down.  He does have a history of sensitivity to poison ivy though generally this has more of a rash appearance and is pruritic.  He denies any fever, nausea, vomiting.  He has tried Benadryl as well as hydrocortisone cream with improvement but not resolution of symptoms.  Denies history of recurrent skin infections including MRSA or erysipelas.  Denies any history of diabetes.  Reports that he generally requires an injection of steroids in order to manage poison ivy symptoms.    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 Authorizing Provider  amoxicillin-clavulanate (AUGMENTIN) 875-125 MG tablet Take 1 tablet by mouth every 12 (twelve) hours. 08/13/22  Yes Clever Geraldo, Derry Skill, PA-C  acetaminophen (TYLENOL) 500 MG tablet Take 1,000 mg by mouth daily.    [provider]  amLODipine (NORVASC) 5 MG tablet Take 5 mg by mouth daily. 07/19/21   [provider]  lisinopril (ZESTRIL) 40 MG tablet Take 40 mg by mouth daily. 07/19/21   [provider]  Multiple Vitamin (MULTIVITAMIN WITH MINERALS) TABS tablet Take 1 tablet by mouth daily.    [provider]  TURMERIC PO Take 1 capsule by mouth daily.    [provider]    Family History History reviewed. No pertinent family history.  Social History Social History   Tobacco Use   Smoking status: Former   Smokeless tobacco: Never  Substance Use Topics   Alcohol use: Yes    Alcohol/week: 5.0 standard drinks of alcohol    Types: 5 Cans of beer per week    Comment: some every day   Drug use: No     Allergies   Patient has no known allergies.   Review of Systems Review of Systems  Constitutional:  Positive for activity change. Negative for appetite change, fatigue and fever.  Respiratory:  Negative for cough and shortness of breath.   Cardiovascular:  Negative for chest pain.  Gastrointestinal:  Negative for abdominal pain, diarrhea, nausea and vomiting.  Skin:  Positive for rash.  Neurological:  Negative for weakness and numbness.     Physical Exam Triage Vital Signs ED Triage Vitals  Enc Vitals Group  BP 08/13/22 1714 (!) 147/77     Pulse Rate 08/13/22 1714 77     Resp 08/13/22 1714 16     Temp 08/13/22 1714 98.3 F (36.8 C)     Temp Source 08/13/22 1714 Oral     SpO2 08/13/22 1714 98 %     Weight --      Height --      Head Circumference --      Peak Flow --      Pain Score 08/13/22 1713 4     Pain Loc --      Pain Edu? --      Excl. in [place]? --    No data found.  Updated Vital Signs BP (!) 147/77 (BP Location: Left Arm)   Pulse 77   Temp 98.3 F (36.8 C) (Oral)   Resp 16   SpO2 98%   Visual Acuity Right Eye Distance:   Left Eye Distance:   Bilateral Distance:    Right Eye Near:   Left Eye Near:    Bilateral Near:      Physical Exam Vitals reviewed.  Constitutional:      General: He is awake.     Appearance: Normal appearance. He is well-developed. He is not ill-appearing.     Comments: Very pleasant male appears stated age in no acute distress sitting comfortably in exam room  HENT:     Head: Normocephalic and atraumatic.     Mouth/Throat:     Pharynx: Uvula midline. No oropharyngeal exudate or posterior oropharyngeal erythema.     Comments: Normal-appearing posterior oropharynx Cardiovascular:     Rate and Rhythm: Normal rate and regular rhythm.     Heart sounds: Normal heart sounds, S1 normal and S2 normal. No murmur heard. Pulmonary:     Effort: Pulmonary effort is normal.     Breath sounds: Normal breath sounds. No stridor. No wheezing, rhonchi or rales.     Comments: Clear to auscultation bilaterally Skin:    Findings: Erythema present.     Comments: Well-demarcated erythema involving right half of face.  No vesicles or papules noted.  No additional lesions or rash noted.  Neurological:     Mental Status: He is alert.  Psychiatric:        Behavior: Behavior is cooperative.      UC Treatments / Results  Labs (all labs ordered are listed, but only abnormal results are displayed) Labs Reviewed - No data to display  EKG   Radiology No results found.  Procedures Procedures (including critical care time)  Medications Ordered in UC Medications  methylPREDNISolone acetate (DEPO-MEDROL) injection 60 mg (60 mg Intramuscular Given 08/13/22 1742)    Initial Impression / Assessment and Plan / UC Course  I have reviewed the triage vital signs and the nursing notes.  Pertinent labs & imaging results that were available during my care of the patient were reviewed by me and considered in my medical decision making (see chart for details).     Patient is well-appearing, afebrile, nontoxic, nontachycardic.  Unclear etiology of symptoms.  Discussed that it is possible this is related to  poison ivy and so he was given injection of Depo-Medrol 60 mg.  Given well-demarcated erythema I am also concern for erysipelas and so we will start Augmentin twice daily for 7 days.  No indication for admission or intravenous antibiotics as he is well-appearing with no rapid spread of symptoms.  Recommended that he alternate H1 H2 blockade in order to  manage any allergic component.  He was encouraged to use hypoallergenic soaps and detergents.  We discussed that if his symptoms change or worsen anyway and he has spread of rash, fever, nausea, vomiting he needs to be seen immediately.  We discussed that if he has any changing symptoms he should go to the emergency room to which he expressed understanding.  Recommended close follow-up with his primary care.  Strict return precautions given.  Final Clinical Impressions(s) / UC Diagnoses   Final diagnoses:  Erysipelas  Erythema of face     Discharge Instructions      I am not completely convinced that your symptoms are only related to poison ivy.  We gave you an injection of steroids which should help with the poison ivy if that is the cause.  Please continue antihistamines to help with your symptoms (I recommend cetirizine in the morning and famotidine at night which are both available over-the-counter).  Keep area clean with soap and water.  To ensure that there is not an infection causing your symptoms we are going to start an antibiotic.  Take Augmentin twice daily for 7 days.  If you have any worsening or changing symptoms including spread of redness, fever, nausea, vomiting, difficulty swallowing, swelling of your throat you need to go to the emergency room immediately.     ED Prescriptions     Medication Sig Dispense Auth. Provider   amoxicillin-clavulanate (AUGMENTIN) 875-125 MG tablet Take 1 tablet by mouth every 12 (twelve) hours. 14 tablet Frantz Quattrone, Derry Skill, PA-C      PDMP not reviewed this encounter.   Terrilee Croak, PA-C 08/13/22  1817

## 2022-08-13 NOTE — ED Triage Notes (Signed)
Patient presents to UC for poison ivy rash to right side of face, treating with cortisone and benadryl. Rash appeared on face since last night.

## 2023-06-01 IMAGING — DX DG HIP (WITH OR WITHOUT PELVIS) 2-3V*R*
2 series · 2 of 2 positions shown · non-contrast
Comparison: Same day radiograph.

CLINICAL DATA: Postop

EXAM:
DG HIP (WITH OR WITHOUT PELVIS) 2-3V RIGHT

[hip ap]
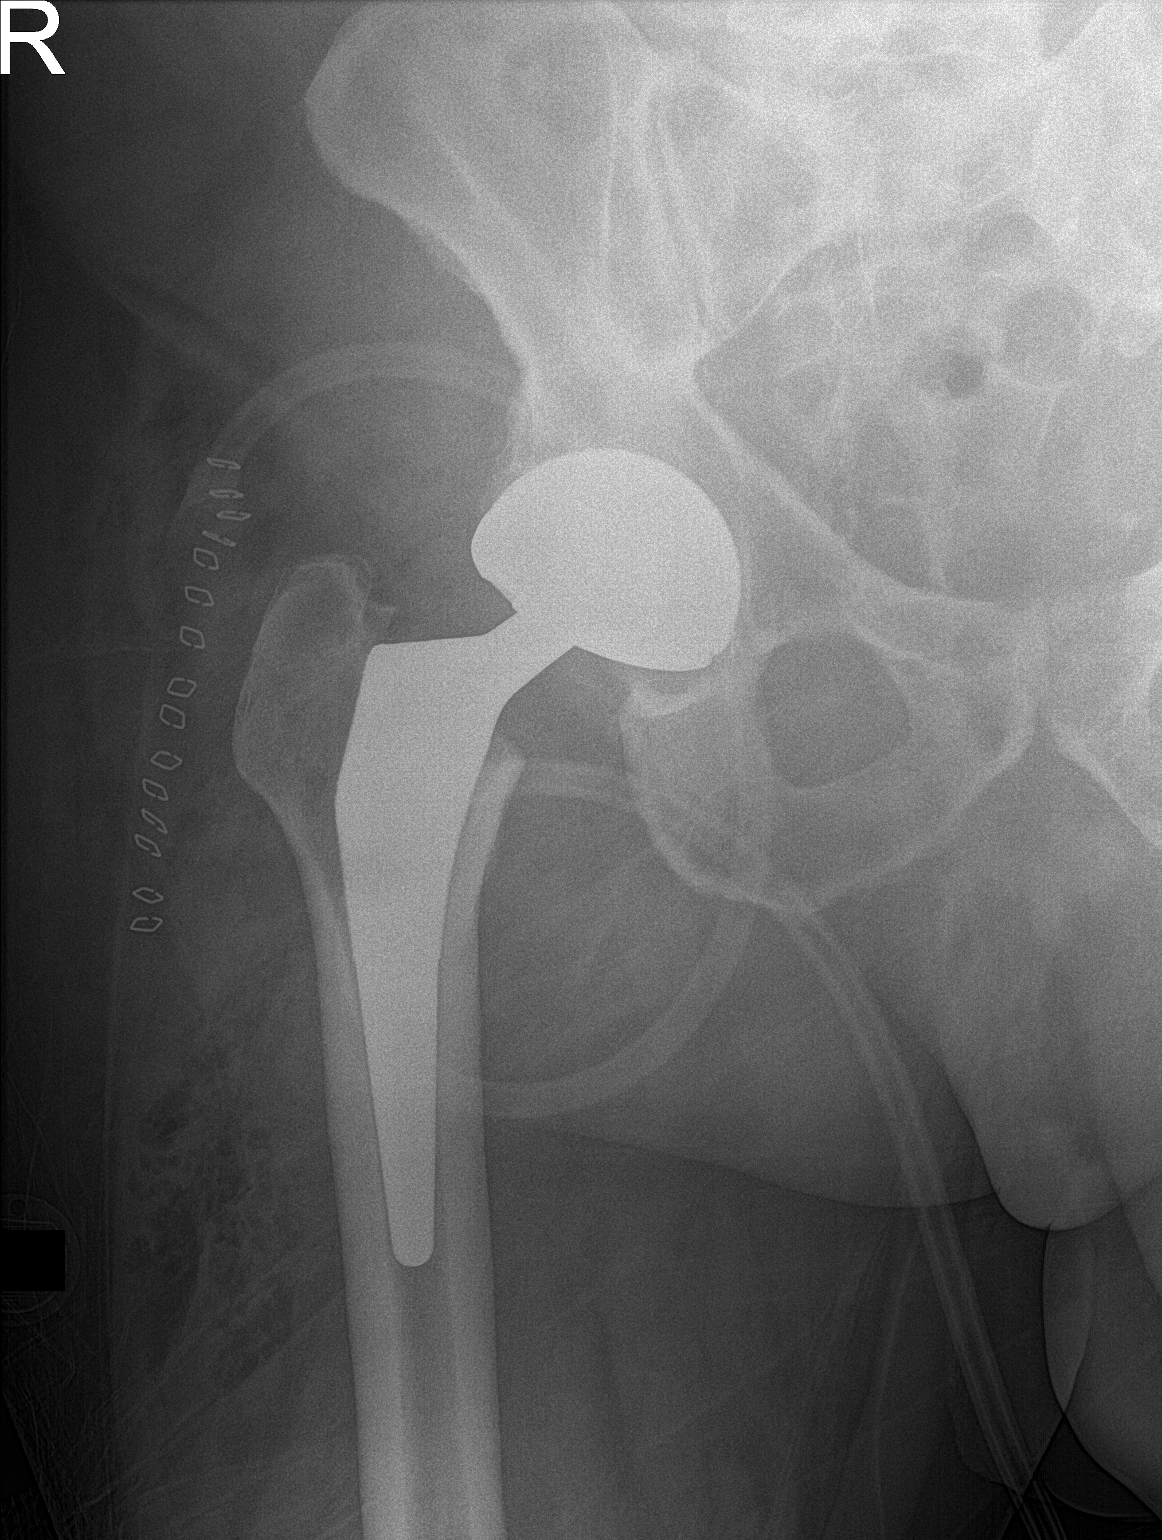

[hip lat]
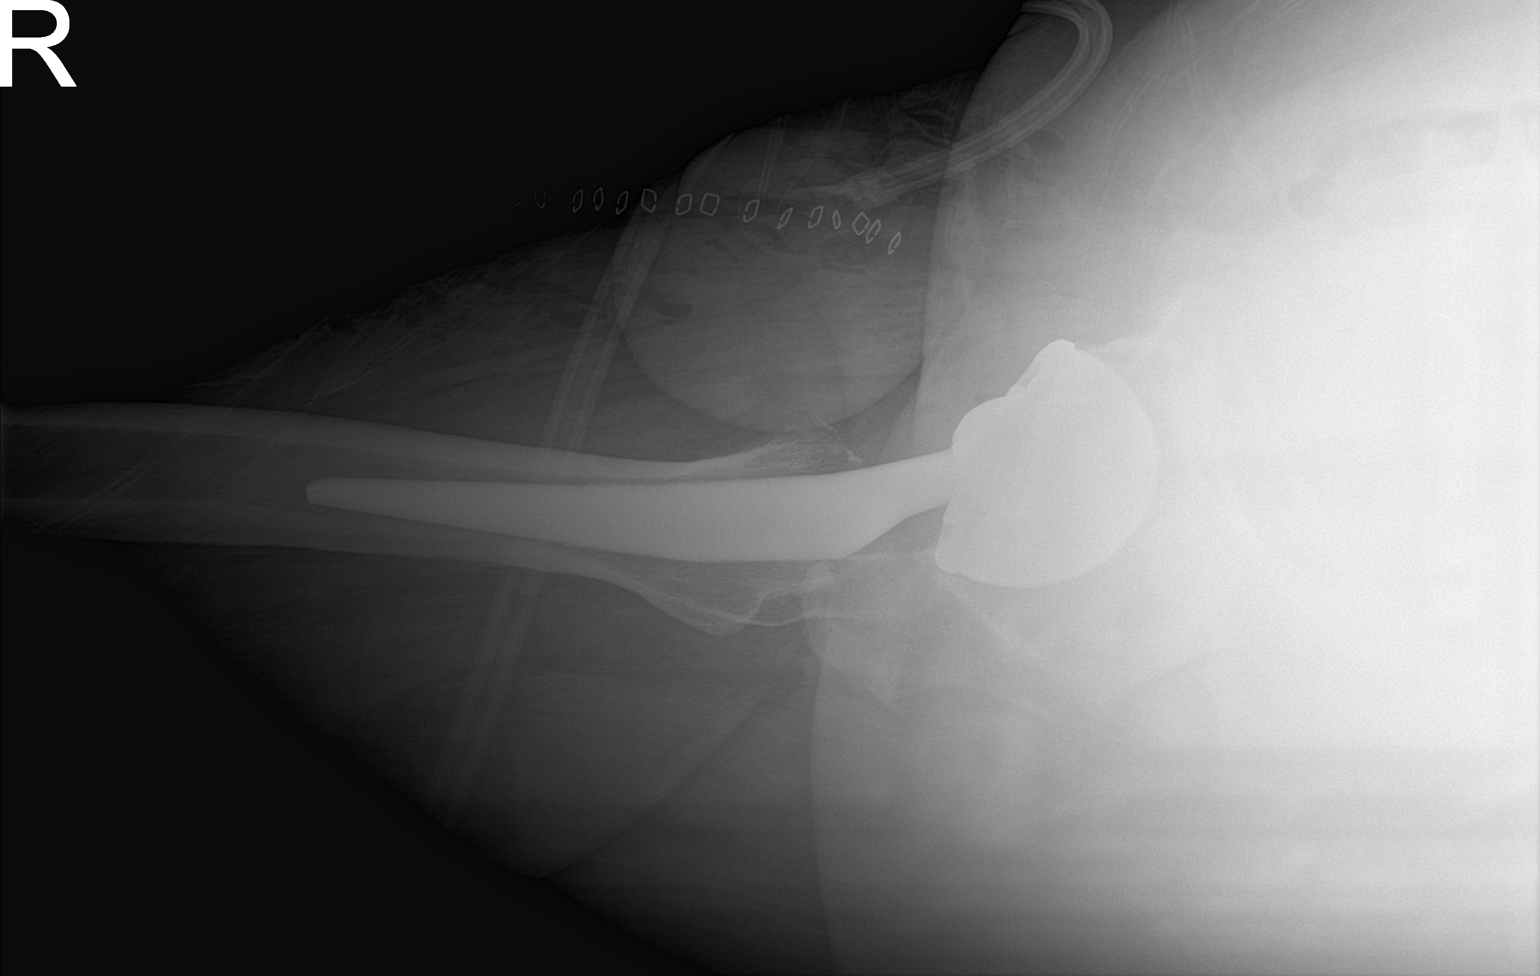

[2 of 2 positions shown; findings below may reference images not displayed]

FINDINGS: Postoperative changes of right total hip arthroplasty with anatomic
alignment and no evidence of acute hardware complication.
IMPRESSION: Postoperative changes of right total hip arthroplasty with anatomic
alignment.
